# Patient Record
Sex: Female | Born: 1960 | Race: Black or African American | Hispanic: No | Marital: Single | Smoking: Never smoker
Health system: Southern US, Community
[De-identification: ages and names within clinical notes are randomized; demographics above are authoritative.]

## PROBLEM LIST (undated history)

## (undated) DIAGNOSIS — E559 Vitamin D deficiency, unspecified: Secondary | ICD-10-CM

## (undated) DIAGNOSIS — Z8613 Personal history of malaria: Secondary | ICD-10-CM

## (undated) DIAGNOSIS — D219 Benign neoplasm of connective and other soft tissue, unspecified: Secondary | ICD-10-CM

## (undated) DIAGNOSIS — R87619 Unspecified abnormal cytological findings in specimens from cervix uteri: Secondary | ICD-10-CM

## (undated) DIAGNOSIS — F32A Depression, unspecified: Secondary | ICD-10-CM

## (undated) DIAGNOSIS — F419 Anxiety disorder, unspecified: Secondary | ICD-10-CM

## (undated) DIAGNOSIS — N939 Abnormal uterine and vaginal bleeding, unspecified: Secondary | ICD-10-CM

## (undated) HISTORY — DX: Abnormal uterine and vaginal bleeding, unspecified: N93.9

## (undated) HISTORY — DX: Vitamin D deficiency, unspecified: E55.9

## (undated) HISTORY — PX: CHOLECYSTECTOMY: SHX55

## (undated) HISTORY — DX: Benign neoplasm of connective and other soft tissue, unspecified: D21.9

## (undated) HISTORY — DX: Unspecified abnormal cytological findings in specimens from cervix uteri: R87.619

## (undated) HISTORY — DX: Personal history of malaria: Z86.13

---

## 2005-05-16 ENCOUNTER — Other Ambulatory Visit: Admission: RE | Admit: 2005-05-16 | Discharge: 2005-05-16 | Payer: Self-pay | Admitting: Cardiology

## 2005-05-20 ENCOUNTER — Encounter: Admission: RE | Admit: 2005-05-20 | Discharge: 2005-05-20 | Payer: Self-pay | Admitting: Internal Medicine

## 2005-06-20 ENCOUNTER — Encounter: Admission: RE | Admit: 2005-06-20 | Discharge: 2005-06-20 | Payer: Self-pay | Admitting: Obstetrics and Gynecology

## 2007-01-05 ENCOUNTER — Other Ambulatory Visit: Admission: RE | Admit: 2007-01-05 | Discharge: 2007-01-05 | Payer: Self-pay | Admitting: Internal Medicine

## 2007-05-12 ENCOUNTER — Encounter: Admission: RE | Admit: 2007-05-12 | Discharge: 2007-05-12 | Payer: Self-pay | Admitting: Obstetrics & Gynecology

## 2010-04-23 ENCOUNTER — Encounter
Admission: RE | Admit: 2010-04-23 | Discharge: 2010-04-23 | Payer: Self-pay | Source: Home / Self Care | Attending: Internal Medicine | Admitting: Internal Medicine

## 2010-11-25 ENCOUNTER — Emergency Department (HOSPITAL_COMMUNITY): Payer: Managed Care, Other (non HMO)

## 2010-11-25 ENCOUNTER — Observation Stay (HOSPITAL_COMMUNITY)
Admission: EM | Admit: 2010-11-25 | Discharge: 2010-11-26 | Disposition: A | Discharge status: Home / Self Care | Payer: Managed Care, Other (non HMO) | Attending: Surgery | Admitting: Surgery

## 2010-11-25 ENCOUNTER — Other Ambulatory Visit (INDEPENDENT_AMBULATORY_CARE_PROVIDER_SITE_OTHER): Payer: Self-pay | Admitting: Surgery

## 2010-11-25 ENCOUNTER — Inpatient Hospital Stay (HOSPITAL_COMMUNITY): Payer: Managed Care, Other (non HMO)

## 2010-11-25 DIAGNOSIS — R112 Nausea with vomiting, unspecified: Secondary | ICD-10-CM

## 2010-11-25 DIAGNOSIS — R1011 Right upper quadrant pain: Secondary | ICD-10-CM | POA: Insufficient documentation

## 2010-11-25 DIAGNOSIS — K8010 Calculus of gallbladder with chronic cholecystitis without obstruction: Secondary | ICD-10-CM

## 2010-11-25 DIAGNOSIS — Z01812 Encounter for preprocedural laboratory examination: Secondary | ICD-10-CM | POA: Insufficient documentation

## 2010-11-25 DIAGNOSIS — R1013 Epigastric pain: Secondary | ICD-10-CM

## 2010-11-25 DIAGNOSIS — E669 Obesity, unspecified: Secondary | ICD-10-CM | POA: Insufficient documentation

## 2010-11-25 LAB — COMPREHENSIVE METABOLIC PANEL
ALT: 16 U/L (ref 0–35)
AST: 43 U/L — ABNORMAL HIGH (ref 0–37)
Albumin: 4 g/dL (ref 3.5–5.2)
Alkaline Phosphatase: 49 U/L (ref 39–117)
BUN: 9 mg/dL (ref 6–23)
Potassium: 3.8 mEq/L (ref 3.5–5.1)
Total Protein: 8 g/dL (ref 6.0–8.3)

## 2010-11-25 LAB — CBC
HCT: 41.9 % (ref 36.0–46.0)
Hemoglobin: 13.4 g/dL (ref 12.0–15.0)
MCHC: 32 g/dL (ref 30.0–36.0)
MCV: 80.4 fL (ref 78.0–100.0)
RBC: 5.21 MIL/uL — ABNORMAL HIGH (ref 3.87–5.11)
RDW: 13.2 % (ref 11.5–15.5)
WBC: 7.4 10*3/uL (ref 4.0–10.5)

## 2010-11-25 LAB — DIFFERENTIAL
Basophils Absolute: 0 10*3/uL (ref 0.0–0.1)
Basophils Relative: 0 % (ref 0–1)
Eosinophils Absolute: 0.1 10*3/uL (ref 0.0–0.7)
Monocytes Relative: 6 % (ref 3–12)
Neutro Abs: 5.5 10*3/uL (ref 1.7–7.7)
Neutrophils Relative %: 75 % (ref 43–77)

## 2010-11-25 LAB — LIPASE, BLOOD: Lipase: 18 U/L (ref 11–59)

## 2010-11-28 NOTE — Op Note (Signed)
[name], [name]              ACCOUNT NO.:  [id number]  MEDICAL RECORD NO.:  [id number]  LOCATION:  1526                         FACILITY:  [facility]  PHYSICIAN:  Velora Heckler, MD      DATE OF BIRTH:  Apr 25, 1960  DATE OF PROCEDURE:  11/25/2010 DATE OF DISCHARGE:                              OPERATIVE REPORT   PREOPERATIVE DIAGNOSIS:  Symptomatic cholelithiasis.  POSTOPERATIVE DIAGNOSIS:  Symptomatic cholelithiasis, subacute cholecystitis.  PROCEDURE:  Laparoscopic cholecystectomy with intraoperative cholangiography.  SURGEON:  Velora Heckler, MD, FACS  ASSISTANT:  Brayton El, PA-C  ANESTHESIA:  General per Dr. Eilene Ghazi.  ESTIMATED BLOOD LOSS:  Minimal.  PREPARATION:  ChloraPrep.  COMPLICATIONS:  None.  INDICATIONS:  The patient is a 50 year old black female who presents to the emergency department with a 24 hour history of abdominal pain. Ultrasound demonstrated gallstones with a large stone lodged in the gallbladder neck.  Liver function tests were normal.  The patient had persistent symptoms despite medical treatment in the emergency department.  She was seen in consultation by Surgery and prepared for the operating room.  BODY OF REPORT:  Procedure was done in OR #6 at the [facility].  The patient was brought to the operating room, placed in the supine position on the operating room table.  Following administration of general anesthesia, the patient is positioned and then prepped and draped in the usual strict aseptic fashion.  After ascertaining that, an adequate level of anesthesia been achieved, an infraumbilical incision was made transversely with a #15 blade. Dissection was carried down to the fascia.  Fascia was incised in the midline and the peritoneal cavity was entered cautiously.  0-Vicryl pursestring suture was placed in the fascia.  Hasson cannula was introduced under direct vision and secured with a pursestring  suture. Abdomen was insufflated with carbon dioxide.  Laparoscope was introduced and the abdomen explored.  Operative ports were placed along the right costal margin in the midline, midclavicular line, and anterior axillary line.  Fundus of the gallbladder was grasped and retracted cephalad. Gallbladder wall was markedly thickened.  Adhesions to the gallbladder wall were taken down with gentle blunt dissection and hemostasis obtained with electrocautery.  Gallbladder was quite fibrotic.  There was edema.  Dissection was begun at the neck of the gallbladder.  Cystic duct was identified.  It was dissected out along its length and a clip was placed at the neck of the gallbladder.  Cystic duct was incised.  A Cook cholangiography catheter was introduced through a stab wound in the right upper quadrant and inserted into the cystic duct.  It was secured with a Ligaclip.  Using C-arm fluoroscopy, real time cholangiography was performed.  There was rapid filling of a normal-caliber common bile duct.  There was free flow distally into the duodenum without filling defect or obstruction.  There was reflux of contrast into the right and left hepatic ductal systems.  Clip was withdrawn and Cook catheter was removed from the peritoneal cavity.  Cystic duct was doubly clipped and divided.  Cystic artery was dissected out, doubly clipped, and divided. Posterior branch of the cystic artery was doubly clipped and divided. Gallbladder  was excised from the gallbladder bed using the hook electrocautery for hemostasis.  The entire gallbladder was excised and placed into an EndoCatch bag.  It was withdrawn through the umbilical port.  It contains at least 1 large gallstone on palpation.  It was submitted to Pathology for review.  Right upper quadrant was irrigated with warm saline, which was evacuated.  Good hemostasis was noted.  Ports were removed under direct vision.  0-Vicryl pursestring sutures tied  securely.  Good hemostasis was noted at all port sites.  Pneumoperitoneum was released.  Port sites were anesthetized with local anesthetic.  Skin incisions were closed with interrupted 4-0 Monocryl subcuticular sutures.  Wounds were washed and dried and Benzoin and Steri-Strips were applied.  Sterile dressings were applied.  The patient was awakened from anesthesia and brought to the recovery room.  The patient tolerated the procedure well.   Velora Heckler, MD, FACS     TMG/MEDQ  D:  11/25/2010  T:  11/26/2010  Job:  [id number]  Electronically Signed by Darnell Level MD on 11/28/2010 02:13:14 PM

## 2010-12-10 ENCOUNTER — Ambulatory Visit (INDEPENDENT_AMBULATORY_CARE_PROVIDER_SITE_OTHER): Payer: Managed Care, Other (non HMO) | Admitting: General Surgery

## 2010-12-10 ENCOUNTER — Encounter (INDEPENDENT_AMBULATORY_CARE_PROVIDER_SITE_OTHER): Payer: Self-pay | Admitting: General Surgery

## 2010-12-10 VITALS — BP 120/82

## 2010-12-10 DIAGNOSIS — K8010 Calculus of gallbladder with chronic cholecystitis without obstruction: Secondary | ICD-10-CM

## 2010-12-10 NOTE — Progress Notes (Signed)
[name] is a 50 y.o. female who had a laparoscopic cholecystectomy with intraoperative cholangiogram.  The pathology report confirmed Chronic Cholecystitis and cholelithiasis.  The patient reports that they are feeling well with normal bowel movements and good appetite.  The pre-operative symptoms of abdominal pain, nausea, and vomiting have resolved.    Physical examination - Incisions appear well-healed with no sign of infection or bleeding.   Abdomen - soft, non-tender  Impression:  s/p laparoscopic cholecystectomy  Plan:  She may resume a regular diet and full activity.  She may follow-up on a PRN basis.

## 2010-12-12 NOTE — Discharge Summary (Signed)
  NAMEAERYN, [name]              ACCOUNT NO.:  [id number]  MEDICAL RECORD NO.:  [id number]  LOCATION:  1526                         FACILITY:  [facility]  PHYSICIAN:  Vanessa Heckler, MD      DATE OF BIRTH:  04-11-1960  DATE OF ADMISSION:  11/25/2010 DATE OF DISCHARGE:  11/26/2010                              DISCHARGE SUMMARY   ADMISSION DIAGNOSIS:  Cholelithiasis, symptomatic.  DISCHARGE DIAGNOSES:  Symptomatic cholelithiasis and subacute cholecystitis.  PROCEDURE:  Laparoscopic cholecystectomy with intraoperative cholangiogram on November 25, 2010, by Dr. Gerrit [name].  BRIEF HISTORY:  The patient is a 50 year old black female who presents with abdominal pain, nausea, and vomiting, which started yesterday after eating a large meal.  Continuation of workup in the ER which included an ultrasound of the abdomen, showed a single gallstone.  There was no wall thickening.  She was seen, evaluated by Dr. Gerrit [name] and subsequently taken to the OR for laparoscopic cholecystectomy.  ADMISSION MEDICATIONS:  None.  ALLERGIES:  None.  Please see dictated H and P for further details.  HOSPITAL COURSE:  The patient was admitted, taken to the OR, and underwent procedures described above.  She tolerated it well.  She has done well overnight.  She is afebrile.  Vital signs were stable.  Blood pressure is quite normal.  She is eating a full breakfast.  She is walking in the halls and is anxious for discharge at this time.  Her abdominal incisions looked good.  She has Steri-Strips.  She had been instructed to take care of the wound to keep her activity limited to lifting over 20 pounds for 2 weeks.  She will go home on ibuprofen and Percocet 1 to 2 p.o. q.4 h. p.r.n. for pain.  She will follow up in the [facility] on December 10, 2010, at 2:20.  CONDITION ON DISCHARGE:  Improved.     [name], P.A.   ______________________________ Vanessa Heckler, MD    WDJ/MEDQ  D:  11/26/2010   T:  11/26/2010  Job:  [id number]  cc:   Vanessa Heckler, MD [address] [place] [address]  [name], M.D. Fax: [phone]  Electronically Signed by Sherrie George P.A. on 12/02/2010 09:20:32 PM Electronically Signed by Darnell Level MD on 12/12/2010 11:26:26 AM

## 2010-12-12 NOTE — H&P (Signed)
[name], [name] NO.:  [id number]  MEDICAL RECORD NO.:  [id number]  LOCATION:  1526                         FACILITY:  [facility]  PHYSICIAN:  Velora Heckler, MD      DATE OF BIRTH:  1961-03-14  DATE OF ADMISSION:  11/25/2010 DATE OF DISCHARGE:                             HISTORY & PHYSICAL   PRIMARY CARE PHYSICIAN:  Juline Patch, M.D.  CHIEF COMPLAINT:  Abdominal pain, nausea, and vomiting.  HISTORY OF PRESENT ILLNESS:  [name] is a pleasant 50 year old African American female who started having some epigastric discomfort and indigestion-type symptoms yesterday afternoon.  She had a rather large meal of chicken alfredo followed by __________.  She developed this discomfort that persisted eventually all afternoon and into the evening that was associated with nausea.  She tried taking some over-the- counter herbal remedies that did not appear to improve her symptoms. Eventually, she was woken up a bit early this morning with persistent nausea and eventual vomiting.  She has no hematemesis.  She denies any fever and she denies any diarrhea or changes in her bowel function.  She presented to the emergency room due to the lack of improvement in her symptoms.  She states that years ago she was told that she may have had a gallbladder attack, but was not aware that she has had gallstones at that time.  She has a known history of fibroids and was planning to have surgery for that, but due to her international travel history, did not want to pursue that until she __________ for a long period of time.  She has been in the emergency department this morning in [facility] and had a workup including labs, which was normal and an ultrasound, which found the gallbladder stone lodged at the neck without any evidence of ductal dilatation.  There is no gallbladder wall thickening or pericholecystic fluid to suggest acute cholecystitis; however, we are called because  the patient has continued discomfort and nausea.  PAST MEDICAL HISTORY:  Fibroids.  SURGICAL HISTORY:  Negative.  FAMILY HISTORY:  Consistent with diabetes and hypertension.  SOCIAL HISTORY:  The patient is a full-time Engineer, agricultural with international travel history, but has been back up in the [place] and intends to stay here.  She, otherwise, denies any alcohol, tobacco, or illicit drug use.  ALLERGIES:  No known drug or LASIX allergies.  MEDICATIONS:  None.  REVIEW OF SYSTEMS:  Please see history of present illness for pertinent findings, otherwise complete 12-system review found  negative.  PHYSICAL EXAMINATION:  GENERAL:  A 50 year old [place] American female who is morbidly obese, but in no acute distress. VITAL SIGNS:  Show temperature of 98.6, heart rate of 75, respiratory rate of 18, blood pressure of 133/86, oxygen saturation is 98% on room air. ENT:  Unremarkable. NECK:  Supple without lymphadenopathy.  Trachea is midline, no thyromegaly or masses. LUNGS:  Clear to auscultation.  No wheezes, rhonchi, or rales.  Normal respiratory effort without use of accessory muscles. HEART:  Regular rate and rhythm.  No murmurs, gallops, or rubs. Carotids 2+ brisk without bruits.  Peripheral pulses intact and symmetrical. ABDOMEN:  Soft, nondistended, and nontender.  No surgical scars, no organomegaly or hernias are appreciated. RECTAL: Deferred. GENITOURINARY:  Deferred. EXTREMITIES:  Good active range of motion all extremities without crepitus or pain.  No muscle tone without atrophy. SKIN:  Otherwise warm to dry with good turgor.  No rashes, lesions, or nodules. NEUROLOGIC:  The patient is alert and oriented x3.  Appropriate affect.  DIAGNOSTICS:  CBC reveals a white blood cell count of 7.4, hemoglobin of 13.4, hematocrit of 41.9, and platelet count of 246.  Metabolic panel shows a sodium of 137, potassium 3.8, chloride of 101, CO2 of 26, BUN of 9, creatinine  of 0.69, and glucose of 119.  Liver enzymes within normal limits including lipase.  Ultrasound shows 1.8 cm gallstone lodged in neck of gallbladder.  No adjacent wall thickening or pericholecystic fluid is identified, bile duct within normal limits.  IMPRESSION:  Symptomatic cholelithiasis.  PLAN:  Given the patient's ongoing nausea and findings of the stone lodged in the neck of the gallbladder, we will see the patient and plan to proceed with laparoscopic cholecystectomy today.  I feel the patient would perfectly bounce back with recurrent symptoms postprandial if allowed to.  The patient understands and agrees with our plan.  The procedure of cholecystectomy laparoscopic versus open was discussed with the patient in detail including the risks, complications, and potential postoperative expectations.  She again understands and will consent     Vanessa El, PA-C   ______________________________ Velora Heckler, MD    KB/MEDQ  D:  11/25/2010  T:  11/25/2010  Job:  [id number]  Electronically Signed by [name]  on 12/02/2010 11:56:24 AM Electronically Signed by Darnell Level MD on 12/12/2010 11:26:40 AM

## 2010-12-25 ENCOUNTER — Encounter (INDEPENDENT_AMBULATORY_CARE_PROVIDER_SITE_OTHER): Payer: Managed Care, Other (non HMO) | Admitting: Surgery

## 2011-07-11 ENCOUNTER — Other Ambulatory Visit: Payer: Self-pay | Admitting: Internal Medicine

## 2011-07-11 DIAGNOSIS — Z1231 Encounter for screening mammogram for malignant neoplasm of breast: Secondary | ICD-10-CM

## 2011-07-18 ENCOUNTER — Ambulatory Visit
Admission: RE | Admit: 2011-07-18 | Discharge: 2011-07-18 | Disposition: A | Discharge status: Home / Self Care | Payer: Managed Care, Other (non HMO) | Source: Ambulatory Visit | Attending: Internal Medicine | Admitting: Internal Medicine

## 2011-07-18 DIAGNOSIS — Z1231 Encounter for screening mammogram for malignant neoplasm of breast: Secondary | ICD-10-CM

## 2013-10-24 ENCOUNTER — Ambulatory Visit (INDEPENDENT_AMBULATORY_CARE_PROVIDER_SITE_OTHER): Payer: 59 | Admitting: Nurse Practitioner

## 2013-10-24 ENCOUNTER — Encounter: Payer: Self-pay | Admitting: Nurse Practitioner

## 2013-10-24 VITALS — BP 114/66 | HR 60 | Ht 64.75 in | Wt 300.0 lb

## 2013-10-24 DIAGNOSIS — N950 Postmenopausal bleeding: Secondary | ICD-10-CM

## 2013-10-24 DIAGNOSIS — Z01419 Encounter for gynecological examination (general) (routine) without abnormal findings: Secondary | ICD-10-CM

## 2013-10-24 DIAGNOSIS — R87618 Other abnormal cytological findings on specimens from cervix uteri: Secondary | ICD-10-CM

## 2013-10-24 DIAGNOSIS — R897 Abnormal histological findings in specimens from other organs, systems and tissues: Secondary | ICD-10-CM

## 2013-10-24 DIAGNOSIS — Z0000 Encounter for general adult medical examination without abnormal findings: Secondary | ICD-10-CM

## 2013-10-24 MED ORDER — FLUCONAZOLE 150 MG PO TABS: 150.0000 mg | ORAL_TABLET | Freq: Once | ORAL | Status: DC

## 2013-10-24 NOTE — Patient Instructions (Signed)

## 2013-10-24 NOTE — Progress Notes (Signed)
Patient ID: [name], female   DOB: 1960-07-31, 53 y.o.   MRN: [id number] 53 y.o. G1P0010 Single African American Fe here to re establish for a NGYN exam.  She has a history of  irregular menses since 2008 -2009.  Then had endo biopsy and PUS and hysterectomy was discussed by Dr. Sabra Heck because [place] and having AUB.  Since then she has had a pattern of PMS symptoms monthly with spotting X 1 day. She was at a year of no menses when this monthly cycle started back again.   Not SA.   She also complains of a rash around the labia and peri anal ares.  During this time she has noted a mole or change in a mole on the left suprapubic area.  Not sure if it has been there before or just gotten larger.  There also seems to be a swollen knot in groin area.  Denies exposure to STD's, other urinary symptoms, or other illness.  Patient's last menstrual period was 09/12/2013.          Sexually active: No.  The current method of family planning is abstinence.    Exercising: No.  The patient does not participate in regular exercise at present. Smoker:  no  Health Maintenance: Pap:  Prior to 2012 MMG:  07/18/11, negative Colonoscopy:  Has scheduling apt on 10/28/13 TDaP:  2007 Labs: PCP takes care of labs and urine   reports that she has never smoked. She has never used smokeless tobacco. She reports that she does not drink alcohol or use illicit drugs.  Past Medical History  Diagnosis Date  . Vitamin D deficiency   . History of malaria   . Abnormal Pap smear of cervix     repeat pap OK, no  colpo  . Abnormal uterine bleeding     endo biopsy by Dr. Sabra Heck, [place]  . Fibroid     Past Surgical History  Procedure Laterality Date  . Cholecystectomy      laproscopic    Current Outpatient Prescriptions  Medication Sig Dispense Refill  . BIOTIN PO Take 1 tablet by mouth daily.      . Cholecalciferol (VITAMIN D-3) 5000 UNITS TABS Take 1 tablet by mouth daily.      Marland Kitchen GLUCOSAMINE HCL  PO Take by mouth.        Marland Kitchen ibuprofen (ADVIL,MOTRIN) 200 MG tablet Take 200 mg by mouth every 6 (six) hours as needed.      . Multiple Vitamin (MULTIVITAMIN) tablet Take 1 tablet by mouth daily.      Marland Kitchen UNKNOWN TO PATIENT B vitamin.  Unsure which one.      . fluconazole (DIFLUCAN) 150 MG tablet Take 1 tablet (150 mg total) by mouth once. Take one tablet.  Repeat in 48 hours if symptoms are not completely resolved.  2 tablet  0   No current facility-administered medications for this visit.    Family History  Problem Relation Age of Onset  . Breast cancer Maternal Grandmother   . Lung cancer Father   . Diabetes Mother   . Cancer Mother     melanoma  . Diabetes Sister   . Other Sister     seizure  . Diabetes Maternal Uncle   . Diabetes Maternal Grandfather   . Diabetes Maternal Uncle   . Epilepsy Cousin     ROS:  Pertinent items are noted in HPI.  Otherwise, a comprehensive ROS was negative.  Exam:  BP 114/66  Pulse 60  Ht 5' 4.75" (1.645 m)  Wt 300 lb (136.079 kg)  BMI 50.29 kg/m2  LMP 09/12/2013 Height: 5' 4.75" (164.5 cm)  Ht Readings from Last 3 Encounters:  10/24/13 5' 4.75" (1.645 m)    General appearance: alert, cooperative and appears stated age Head: Normocephalic, without obvious abnormality, atraumatic Neck: no adenopathy, supple, symmetrical, trachea midline and thyroid normal to inspection and palpation Lungs: clear to auscultation bilaterally Breasts: normal appearance, no masses or tenderness Heart: regular rate and rhythm Abdomen: soft, non-tender; no masses,  no organomegaly Extremities: extremities normal, atraumatic, no cyanosis or edema Skin: Skin color, texture, turgor normal. No rashes or lesions Lymph nodes: Cervical, supraclavicular, and axillary nodes normal. No abnormal inguinal nodes palpated Neurologic: Grossly normal   Pelvic: External genitalia:  Yeast external dermatitis.  There is a mole at the left suprapubic area.  She states swollen  lymph node or nodule.  Very small shotty node left inguinal and only barely palpable.              Urethra:  normal appearing urethra with no masses, tenderness or lesions              Bartholin's and Skene's: normal                 Vagina: normal appearing vagina with normal color and discharge, no lesions              Cervix: anteverted              Pap taken: Yes.   Bimanual Exam:  Uterus:  normal size, contour, position, consistency, mobility, non-tender              Adnexa: no mass, fullness, tenderness               Rectovaginal: Confirms               Anus:  normal sphincter tone, no lesions  A:  Well Woman with normal exam  Peri vs. Post menopausal bleeding - will check [place]  History of AUB  Mole changes left suprapubic area  Yeast vulvar dermatitis  P:   Reviewed health and wellness pertinent to exam  Pap smear taken today  Mammogram is due and is scheduled  Will treat yeast infection with Diflucan  Will have Dr. Sabra Heck check the mole on left suprapubic area and decide if removal or biopsy is needed.  Also will recheck on the shotty node in the groin.  Will plan on repeat endo biopsy and PUS to evaluate the PMB/ AUB  Counseled on breast self exam, mammography screening, adequate intake of calcium and vitamin D, diet and exercise, Kegel's exercises return annually or prn  An After Visit Summary was printed and given to the patient.

## 2013-10-25 ENCOUNTER — Telehealth: Payer: Self-pay

## 2013-10-25 LAB — FOLLICLE STIMULATING HORMONE: FSH: 37.3 m[IU]/mL

## 2013-10-25 NOTE — Telephone Encounter (Signed)
Message copied by Gerda Diss on Tue Oct 25, 2013 12:10 PM ------      Message from: Kem Boroughs R      Created: Tue Oct 25, 2013  8:06 AM       Let patient know that [facility] is only slight elevated so this may not PMB - but still should evaluate as discussed with endo biopsy and PUS as discussed. ------

## 2013-10-25 NOTE — Progress Notes (Signed)
Encounter reviewed by Dr. Brook Silva.  

## 2013-10-25 NOTE — Telephone Encounter (Signed)
LMOM to contact office 

## 2013-10-26 NOTE — Telephone Encounter (Signed)
Per stephanie, she informed pt of results Encounter closed

## 2013-10-27 LAB — IPS PAP TEST WITH HPV

## 2013-10-28 NOTE — Addendum Note (Signed)
Addended by: Regina Eck on: 10/28/2013 08:51 AM   Modules accepted: Orders

## 2013-11-03 ENCOUNTER — Telehealth: Payer: Self-pay | Admitting: Nurse Practitioner

## 2013-11-03 NOTE — Telephone Encounter (Signed)
Spoke with patient regarding colposcopy results and orders for pelvic ultrasound and possible endometrial biopsy.   Patient recently seen by Milford Cage, FNP with complaints of irregular spotting that has been ongoing for 6 years. Also, has a hx of fibroids that has been previously diagnosed by Dr. Sabra Heck, has not been in for exam in our office since 2008 prior to recent annual with Milford Cage, [place].   Patient agreeable to scheduling procedures.   Patient has not had a regular cycle, FSH 37.3. Advised would review with Dr. Sabra Heck for recommendations on order of procedures and timing because she has not had a regular cycle.  Patient states she is not sexually active.   Dr. Sabra Heck, schedule colposcopy first? Also, when to schedule colposcopy and possible endometrial with irregular cycles/spotting?

## 2013-11-03 NOTE — Telephone Encounter (Signed)
Spoke with patient. Discussed out of pocket expense for PUS ($25 copay) and Colpo ($393.82). Passed call to [facility] because patient has questions about colpo.

## 2013-11-03 NOTE — Telephone Encounter (Signed)
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 Patient had previous history of abnormal per history on pap. Needs colposcopy evaluation please schedule, order in ------

## 2013-11-04 NOTE — Telephone Encounter (Signed)
Since not SA, doesn't really matter about either colposcopy or ultrasound.  Spotting won't interfere with either.  Is there anyway I can do both in a day?

## 2013-11-07 NOTE — Telephone Encounter (Signed)
Message left to return call to [place] at [phone].   Need to schedule Pelvic Ultrasound and Colposcopy.

## 2013-11-08 NOTE — Telephone Encounter (Signed)
Spoke with patient. Advised PUS and colpo can not be done in the same day due to medical reasons. Advised will need to be done in separate appointments but the order is up to her. Patient is unavailable for ultrasound this Thursday. Appointment scheduled for next Thursday 8/13 at 3:30pm with Dr.Miller. Patient agreeable to date and time. Colpo scheduled for 8/27 at 10am with Dr.Miller. Patient agreeable to date and time.   Routing to Dr.Miller Cc: Milford Cage, FNP    Routing to provider for final review. Patient agreeable to disposition. Will close encounter

## 2013-11-08 NOTE — Telephone Encounter (Signed)
Phone call passed to me after speaking with Ivar Drape. Advised patient of message as seen below from [place]. Advised patient that I would have to speak with Dr.Miller and check scheduling as I want to allow enough time to have both done in one day and want to make sure we have her scheduled correctly. Patient is agreeable and verbalizes understanding.

## 2013-11-16 ENCOUNTER — Telehealth: Payer: Self-pay | Admitting: Obstetrics & Gynecology

## 2013-11-16 NOTE — Telephone Encounter (Signed)
Patient called to reschedule PUS appt, she thinks she has the flu.

## 2013-11-17 ENCOUNTER — Other Ambulatory Visit: Payer: 59

## 2013-11-17 ENCOUNTER — Other Ambulatory Visit: Payer: 59 | Admitting: Obstetrics & Gynecology

## 2013-11-21 ENCOUNTER — Ambulatory Visit (INDEPENDENT_AMBULATORY_CARE_PROVIDER_SITE_OTHER): Payer: 59 | Admitting: Gynecology

## 2013-11-21 ENCOUNTER — Telehealth: Payer: Self-pay

## 2013-11-21 VITALS — BP 132/74 | HR 76 | Resp 18 | Ht 64.75 in | Wt 299.0 lb

## 2013-11-21 DIAGNOSIS — N9089 Other specified noninflammatory disorders of vulva and perineum: Secondary | ICD-10-CM

## 2013-11-21 MED ORDER — DOXYCYCLINE HYCLATE 100 MG PO CAPS: 100.0000 mg | ORAL_CAPSULE | Freq: Two times a day (BID) | ORAL | Status: DC

## 2013-11-21 MED ORDER — LIDOCAINE 5 % EX OINT: 1.0000 "application " | TOPICAL_OINTMENT | Freq: Four times a day (QID) | CUTANEOUS | Status: DC | PRN

## 2013-11-21 MED ORDER — OXYCODONE-ACETAMINOPHEN 5-325 MG PO TABS
2.0000 | ORAL_TABLET | ORAL | Status: DC | PRN
Start: 2013-11-21 — End: 2013-12-05

## 2013-11-21 MED ORDER — MUPIROCIN 2 % EX OINT: 1.0000 "application " | TOPICAL_OINTMENT | Freq: Two times a day (BID) | CUTANEOUS | Status: DC

## 2013-11-21 NOTE — Progress Notes (Signed)
Subjective:     Patient ID: [name], female   DOB: 1960-09-02, 53 y.o.   MRN: [id number]  HPI Comments: Pt here with a several day history of labial swelling and discomfort.  Pt was noted to have a yeast vulvitis at her annual 10/2013 and was treated with diflucan.   Pt states that she was later seen at PCP for left axillary swelling and cough and she was given rx for bronchitis-zithromycin.  Pt states that her axilla resolved immediately but the labial lesion continued to get worse to the point that she was going to go to ER this am but was able to be seen at PCP who referred her here. Pt is not sexually active, she wares panitshields daily.    Review of Systems  Constitutional: Negative for fever, chills and fatigue.  Genitourinary: Positive for vaginal bleeding (spotting) and vaginal pain (left vulvar). Negative for decreased urine volume, vaginal discharge and genital sores.       Objective:   Physical Exam  Nursing note and vitals reviewed. Constitutional: She is oriented to person, place, and time. She appears well-developed and well-nourished. No distress.  Genitourinary:    There is no rash, tenderness, lesion or injury on the right labia. There is tenderness, lesion and injury on the left labia.  Pelvic adenopathy difficult to establish due to habitus Unable to place speculum due to extreme discomfort 2% lidocaine jelly placed on Left labia to aid in exam insufficient softness for I&D Area cleansed with betadine and white head lifted and clear to yellow fluid obtained and sent for culture  Neurological: She is alert and oriented to person, place, and time.  Skin: Skin is warm and dry. She is not diaphoretic.       Assessment:     Labial lesion      Plan:        Sitz baths three times a day-warm water Apply 5% xylocaine ointment, pill roll  Percocet as needed,  Donut to sit on bactroban to area twice a day Doxycycline twice a day F/u 1w Pt verbalizes  understanding of instructions Questions addressed

## 2013-11-21 NOTE — Patient Instructions (Signed)
Sitz baths three times a day-warm water Apply 5% xylocaine ointment, pill roll  Percocet as needed,  Donut to sit on bactroban to area twice a day Doxycycline twice a day

## 2013-11-21 NOTE — Telephone Encounter (Signed)
Spoke with patient. Appointment scheduled for today at 4pm with Dr.Lathrop. Patient agreeable to date and time. Patient would like to know if she will have to stay overnight some where as she was told she might have to. Advised patient we are usually able to open and drain here but that Dr.Lathrop will have to look at it first and make further recommendations from there. If anything further needs to be done that can't be done in the office Dr.Lathrop will let her know. Patient is agreeable and verbalizes understanding.  Routing to Dr.Lathrop Cc: Milford Cage, FNP   Routing to provider for final review. Patient agreeable to disposition. Will close encounter

## 2013-11-21 NOTE — Telephone Encounter (Signed)
Spoke with Stanton Kidney from Dr.Pang's office who is seeing Dr.Pang's patient's while she is out of the office. Stanton Kidney states that she saw patient last week and started on antibiotics for respiratory. Patient also had swelling in groin area. Thought the antibiotics would take care of it but patient is in office today for follow up and has a "left labial abscess that has grown in size and is painful." "[facility] she will need it [facility]. Since she see's Dr.Miller I wanted to call to see if you would like to see her first." Advised patient could be seen in our office. Advised Dr.Miller is currently in surgery but that I will check with a covering provider to see about best time for patient to be seen. Stanton Kidney states patient needs to be seen today. Advised would call back back with appointment time. They are both agreeable.

## 2013-11-23 ENCOUNTER — Telehealth: Payer: Self-pay | Admitting: Gynecology

## 2013-11-23 NOTE — Telephone Encounter (Signed)
Patient has a cyst and was give medication. Patient thinks the cyst "broke" last night. Patient is asking what is her next step?

## 2013-11-23 NOTE — Telephone Encounter (Signed)
Spoke with patient. She states that she is feeling about 50% better. Last night, labial cyst ruptured and patient states that she had discharge that she was not expecting. Patient at home from work today and resting in bed. Taking antibiotics as prescribed. Using Bactroban bid and lidocaine jelly qid.  Patient declines office visit for today with Dr. Charlies Constable for revaluation.  Advised that cyst is softening and that drainage is expected. Advised to keep area as clean and dry as possible, use clean hands and linens to clean area. Patient wondering if she can use non-stick guaze, advised to keep area uncovered if possible.  Advised patient to call back any time if worsens, develops fevers or any further concerns.  Advised patient to continue and finish abx as directed.  Routing to provider for final review. Patient agreeable to disposition. Will close encounter

## 2013-11-24 ENCOUNTER — Telehealth: Payer: Self-pay | Admitting: Gynecology

## 2013-11-24 ENCOUNTER — Other Ambulatory Visit: Payer: 59

## 2013-11-24 ENCOUNTER — Other Ambulatory Visit: Payer: 59 | Admitting: Obstetrics & Gynecology

## 2013-11-24 LAB — WOUND CULTURE
GRAM STAIN: NONE SEEN
GRAM STAIN: NONE SEEN

## 2013-11-24 NOTE — Telephone Encounter (Signed)
Spoke with patient. Patient states that pain in labial cyst is improving but she is still feeling "hardness" on labia and is concerned. She is wondering if she should be doing something else. We spoke yesterday as well and home care instructions were reviewed and patient taking oral Doxycycline, using Bactroban and lidocaine jelly as directed and using sitz baths as well. Offered office visit for evaluation as patient is having concerns. Advised can see provider tomorrow and decide if will still need follow up on Monday or not. Call back with any worsening in symptoms, fevers or further concerns. First available office visit offered with Dr. Quincy Simmonds for 11/25/13 at 1300 and patient agreeable.   Routing to provider for final review. Patient agreeable to disposition. Will close encounter

## 2013-11-24 NOTE — Telephone Encounter (Signed)
The cyst has burst and still draining but feels hard again. Is there something else she should be doing?

## 2013-11-25 ENCOUNTER — Ambulatory Visit (INDEPENDENT_AMBULATORY_CARE_PROVIDER_SITE_OTHER): Payer: 59 | Admitting: Obstetrics and Gynecology

## 2013-11-25 ENCOUNTER — Encounter: Payer: Self-pay | Admitting: Obstetrics and Gynecology

## 2013-11-25 VITALS — BP 126/84 | HR 80 | Ht 64.75 in | Wt 298.4 lb

## 2013-11-25 DIAGNOSIS — N762 Acute vulvitis: Secondary | ICD-10-CM

## 2013-11-25 DIAGNOSIS — N760 Acute vaginitis: Secondary | ICD-10-CM

## 2013-11-25 MED ORDER — AMOXICILLIN-POT CLAVULANATE 875-125 MG PO TABS: 1.0000 | ORAL_TABLET | Freq: Two times a day (BID) | ORAL | Status: DC

## 2013-11-25 MED ORDER — FLUCONAZOLE 150 MG PO TABS: 150.0000 mg | ORAL_TABLET | Freq: Once | ORAL | Status: DC

## 2013-11-25 NOTE — Progress Notes (Signed)
Patient ID: [name], female   DOB: [birth year], 53 y.o.   MRN: [id number] GYNECOLOGY  VISIT   HPI: 53 y.o.   Single  African American  female   G1P0010 with Patient's last menstrual period was 11/21/2013.   here for  Follow up on vulvar lesion.  States a cyst came up in the groin area.  Seen in office 8/17 and no I and D performed due to lack of fluctuance. Received Bactroban and Doxycycline, xylocaine jelly and Percocet. Drained on its own the night of the visit.  Feels like the area is somewhat better but has moved up along the labia.  No fevers.  Wound culture negative from 8/17.  Has not had this before. 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  No outbreak since then.   Of note, patient had a recent treatment of URI with Azythromycin.   GYNECOLOGIC HISTORY: Patient's last menstrual period was 11/21/2013. Contraception: abstinence   Menopausal hormone therapy: n/a        OB History   Grav Para Term Preterm Abortions TAB SAB Ect Mult Living   1    1 1              There are no active problems to display for this patient.   Past Medical History  Diagnosis Date  . Vitamin D deficiency   . History of malaria   . Abnormal Pap smear of cervix     repeat pap OK, no  colpo  . Abnormal uterine bleeding     endo biopsy by Dr. Sabra Heck, [place]  . Fibroid     Past Surgical History  Procedure Laterality Date  . Cholecystectomy      laproscopic    Current Outpatient Prescriptions  Medication Sig Dispense Refill  . benzonatate (TESSALON) 100 MG capsule Take by mouth 3 (three) times daily as needed for cough.      . doxycycline (VIBRAMYCIN) 100 MG capsule Take 1 capsule (100 mg total) by mouth 2 (two) times daily. Take BID for 14 days.  Take with food as can cause GI distress.  28 capsule  0  . ibuprofen (ADVIL,MOTRIN) 200 MG tablet Take 200 mg by mouth every 6 (six) hours as needed.      . lidocaine (XYLOCAINE) 5 % ointment Apply 1 application topically 4 (four) times daily as  needed.  1.25 g  0  . mupirocin ointment (BACTROBAN) 2 % Place 1 application into the nose 2 (two) times daily.  22 g  0  . BIOTIN PO Take 1 tablet by mouth daily.      . Cholecalciferol (VITAMIN D-3) 5000 UNITS TABS Take 1 tablet by mouth daily.      Marland Kitchen GLUCOSAMINE HCL PO Take by mouth.        Marland Kitchen MAGNESIUM PO Take by mouth.      . Multiple Vitamin (MULTIVITAMIN) tablet Take 1 tablet by mouth daily.      Marland Kitchen oxyCODONE-acetaminophen (PERCOCET) 5-325 MG per tablet Take 2 tablets by mouth every 4 (four) hours as needed. use only as much as needed to relieve pain  15 tablet  0  . UNKNOWN TO PATIENT B vitamin.  Unsure which one.       No current facility-administered medications for this visit.     ALLERGIES: Shrimp  Family History  Problem Relation Age of Onset  . Breast cancer Maternal Grandmother   . Lung cancer Father   . Diabetes Mother   . Cancer Mother  melanoma  . Diabetes Sister   . Other Sister     seizure  . Diabetes Maternal Uncle   . Diabetes Maternal Grandfather   . Diabetes Maternal Uncle   . Epilepsy Cousin     History   Social History  . Marital Status: Single    Spouse Name: N/A    Number of Children: 0  . Years of Education: N/A   Occupational History  .     Social History Main Topics  . Smoking status: Never Smoker   . Smokeless tobacco: Never Used  . Alcohol Use: No  . Drug Use: No  . Sexual Activity: No   Other Topics Concern  . Not on file   Social History Narrative  . No narrative on file    ROS:  Pertinent items are noted in HPI.  PHYSICAL EXAMINATION:    BP 126/84  Pulse 80  Ht 5' 4.75" (1.645 m)  Wt 298 lb 6.4 oz (135.353 kg)  BMI 50.02 kg/m2  LMP 11/21/2013     General appearance: alert, cooperative and appears stated age No abnormal inguinal nodes palpated  Pelvic: External genitalia:  Indurated left labia majora with pus extruding from 2 areas of the mid-superior labia.   HSV culture taken. Firm tissue.  Warm to touch.                Urethra:  normal appearing urethra with no masses, tenderness or lesions              Bartholins and Skenes: normal                   I and D of vulvar abscess. Consent for procedure.  Sterile prep with Hibiclens.   Local 1% lidocaine - lot number [id number], Expiration 09/06/14.         Scalpel used to open 1 cm area and probe with hemostat. Wound culture sent.  Minimal EBL.  No complications.             ASSESSMENT  Vulvar cellulitis.  I and D performed, but negative for true abscess. Negative wound culture from 11/21/13.  History of HSV.    PLAN  Stop Doxycycline.  Switch to Augmentin 875 mg po bid for 7 days.  Warm soaks with antibacterial soap.  Stop Bactroban and xylocaine jelly.  Recheck in 3 days in office.  To hospital during the weekend if has progression of cellulitis or develops fever.    An After Visit Summary was printed and given to the patient.  _15___ minutes face to face time of which over 50% was spent in counseling.

## 2013-11-28 ENCOUNTER — Ambulatory Visit (INDEPENDENT_AMBULATORY_CARE_PROVIDER_SITE_OTHER): Payer: 59 | Admitting: Obstetrics and Gynecology

## 2013-11-28 ENCOUNTER — Ambulatory Visit: Payer: 59 | Admitting: Gynecology

## 2013-11-28 ENCOUNTER — Encounter: Payer: Self-pay | Admitting: Obstetrics and Gynecology

## 2013-11-28 VITALS — BP 120/80 | HR 70 | Ht 64.75 in | Wt 303.2 lb

## 2013-11-28 DIAGNOSIS — N762 Acute vulvitis: Secondary | ICD-10-CM

## 2013-11-28 DIAGNOSIS — N760 Acute vaginitis: Secondary | ICD-10-CM

## 2013-11-28 LAB — WOUND CULTURE
Gram Stain: NONE SEEN
Organism ID, Bacteria: NO GROWTH

## 2013-11-28 LAB — HERPES SIMPLEX VIRUS CULTURE: Organism ID, Bacteria: NOT DETECTED

## 2013-11-28 NOTE — Progress Notes (Signed)
Patient ID: [name], female   DOB: 1960-10-04, 53 y.o.   MRN: [id number] GYNECOLOGY  VISIT   HPI: 53 y.o.   Single  African American  female   [id number] with Patient's last menstrual period was 11/21/2013.   here for  Follow up visit.  Status post Incision and Drainage of left labia majora 3 days ago.  No abscess noted.  Switched antibiotic to Augmentin and stopped Doxycycline.  Repeat wound culture is negative for bacteria.  Herpes culture is not back yet.   Feeling better today.  Some itching externally on left side.  No fevers.   Status she is due to come in for colposcopy.  Pap was normal but positive HR HPV. Also will be having a pelvic ultrasound for uterine bleeding.  Goes for almost one calendar year and then has spotting.  FSH 37.3 one month ago.   GYNECOLOGIC HISTORY: Patient's last menstrual period was 11/21/2013. Contraception:   Abstinence. Menopausal hormone therapy: n/a        OB History   Grav Para Term Preterm Abortions TAB SAB Ect Mult Living   1    1 1              Patient Active Problem List   Diagnosis Date Noted  . Vulvar cellulitis 11/25/2013    Past Medical History  Diagnosis Date  . Vitamin D deficiency   . History of malaria   . Abnormal Pap smear of cervix     repeat pap OK, no  colpo  . Abnormal uterine bleeding     endo biopsy by Dr. Sabra Heck, [place]  . Fibroid     Past Surgical History  Procedure Laterality Date  . Cholecystectomy      laproscopic    Current Outpatient Prescriptions  Medication Sig Dispense Refill  . amoxicillin-clavulanate (AUGMENTIN) 875-125 MG per tablet Take 1 tablet by mouth 2 (two) times daily. Take one tablet BID x 7D  14 tablet  0  . benzonatate (TESSALON) 100 MG capsule Take by mouth 3 (three) times daily as needed for cough.      Marland Kitchen BIOTIN PO Take 1 tablet by mouth daily.      . Cholecalciferol (VITAMIN D-3) 5000 UNITS TABS Take 1 tablet by mouth daily.      . fluconazole (DIFLUCAN) 150 MG tablet Take 1  tablet (150 mg total) by mouth once. Take one tablet.  Repeat in 48 hours if symptoms are not completely resolved.  2 tablet  0  . GLUCOSAMINE HCL PO Take by mouth.        Marland Kitchen ibuprofen (ADVIL,MOTRIN) 200 MG tablet Take 200 mg by mouth every 6 (six) hours as needed.      Marland Kitchen MAGNESIUM PO Take by mouth.      . Multiple Vitamin (MULTIVITAMIN) tablet Take 1 tablet by mouth daily.      Marland Kitchen UNKNOWN TO PATIENT B vitamin.  Unsure which one.      . lidocaine (XYLOCAINE) 5 % ointment Apply 1 application topically 4 (four) times daily as needed.  1.25 g  0  . mupirocin ointment (BACTROBAN) 2 % Place 1 application into the nose 2 (two) times daily.  22 g  0  . oxyCODONE-acetaminophen (PERCOCET) 5-325 MG per tablet Take 2 tablets by mouth every 4 (four) hours as needed. use only as much as needed to relieve pain  15 tablet  0   No current facility-administered medications for this visit.     ALLERGIES: Shrimp  Family History  Problem Relation Age of Onset  . Breast cancer Maternal Grandmother   . Lung cancer Father   . Diabetes Mother   . Cancer Mother     melanoma  . Diabetes Sister   . Other Sister     seizure  . Diabetes Maternal Uncle   . Diabetes Maternal Grandfather   . Diabetes Maternal Uncle   . Epilepsy Cousin     History   Social History  . Marital Status: Single    Spouse Name: N/A    Number of Children: 0  . Years of Education: N/A   Occupational History  .     Social History Main Topics  . Smoking status: Never Smoker   . Smokeless tobacco: Never Used  . Alcohol Use: No  . Drug Use: No  . Sexual Activity: No   Other Topics Concern  . Not on file   Social History Narrative  . No narrative on file    ROS:  Pertinent items are noted in HPI.  PHYSICAL EXAMINATION:    BP 120/80  Pulse 70  Ht 5' 4.75" (1.645 m)  Wt 303 lb 3.2 oz (137.531 kg)  BMI 50.82 kg/m2  LMP 11/21/2013     General appearance: alert, cooperative and appears stated age    Pelvic: External  genitalia:   Left labia majora with 0.75 cm opening of the skin.  Small amount of drainage noted.  Induration present but improved and not extending so far in a superior direction.               Urethra:  normal appearing urethra with no masses, tenderness or lesions              Bartholins and Skenes: normal                 Vagina: normal appearing vagina with normal color and discharge, no lesions              Cervix: cannot see.  Intolerant of speculum exam and long Pederson not adequate for visualization of cervix.                    Bimanual Exam:  Uterus:  uterus is normal size, shape, consistency and nontender.  Exam limited by body habitus.                                       Adnexa:  Exam limited by body habitus.                                       ASSESSMENT  Vulval cellulitis.  Improved on Augmentin.  Culture repeat is negative.  HSV culture pending.  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  Abnormal uterine bleeding.   PLAN  Continue Augmentin.  OK for Diflucan 150 mg now and repeat at end of course of antibiotics.  Wait to do colposcopy and pelvic ultrasound until after cellulitis has resolved.  Will contact with results of HSV culture.  Follow up in one week, sooner as needed.    An After Visit Summary was printed and given to the patient.  __15____ minutes face to face time of which over 50% was spent in counseling.

## 2013-11-29 ENCOUNTER — Telehealth: Payer: Self-pay

## 2013-11-29 NOTE — Telephone Encounter (Signed)
Message copied by Jasmine Awe on Tue Nov 29, 2013  9:37 AM ------      Message from: [place]      Created: Mon Nov 28, 2013  5:19 PM       Please inform patient that HSV culture is negative.       I really think that the reaction on her labia was cellulitis due to bacterial infection.       Continue with the Augmentin as we planned.       I will see her back in one week.             Cc- Marisa Sprinkles ------

## 2013-11-29 NOTE — Telephone Encounter (Signed)
Spoke with patient. Advised of message and results as seen below. Patient is agreeable and verbalizes understanding. Has one week follow up scheduled for 8/31 at 11:30am with Dr.Silva.  Routing to provider for final review. Patient agreeable to disposition. Will close encounter

## 2013-12-01 ENCOUNTER — Ambulatory Visit: Payer: 59 | Admitting: Obstetrics & Gynecology

## 2013-12-05 ENCOUNTER — Ambulatory Visit (INDEPENDENT_AMBULATORY_CARE_PROVIDER_SITE_OTHER): Payer: 59 | Admitting: Obstetrics and Gynecology

## 2013-12-05 ENCOUNTER — Encounter: Payer: Self-pay | Admitting: Obstetrics and Gynecology

## 2013-12-05 VITALS — BP 120/80 | HR 70 | Ht 64.75 in | Wt 299.8 lb

## 2013-12-05 DIAGNOSIS — N926 Irregular menstruation, unspecified: Secondary | ICD-10-CM

## 2013-12-05 DIAGNOSIS — L02818 Cutaneous abscess of other sites: Secondary | ICD-10-CM

## 2013-12-05 DIAGNOSIS — R87810 Cervical high risk human papillomavirus (HPV) DNA test positive: Secondary | ICD-10-CM

## 2013-12-05 DIAGNOSIS — L03818 Cellulitis of other sites: Secondary | ICD-10-CM

## 2013-12-05 DIAGNOSIS — N939 Abnormal uterine and vaginal bleeding, unspecified: Secondary | ICD-10-CM

## 2013-12-05 NOTE — Progress Notes (Signed)
Patient ID: [name], female   DOB: 02-Jul-1960, 53 y.o.   MRN: [id number] GYNECOLOGY  VISIT   HPI: 53 y.o.   Single  African American  female   G1P0010 with Patient's last menstrual period was 11/21/2013.   here for  1 week follow up.   Finished the Augmentin 2 days ago.  Feeling better.  Feels like herself again.  Did have a small spot of bleeding from the vulva this weekend.  Still feels an area of induration, which was previously determined to be likely a lymph node.   Wound culture and HSV cultures have all been negative.   Patient is due to schedule colposcopy for positive HR HPV and pelvic ultrasound and endometrial biopsy for abnormal uterine bleeding.  Had cycles stop for one year and then has resumed with light periodic bleeding.  These visits were put on hold while the patient's vulvar cellulitis was being treated.   GYNECOLOGIC HISTORY: Patient's last menstrual period was 11/21/2013. Contraception: abstinence    Menopausal hormone therapy: none        OB History   Grav Para Term Preterm Abortions TAB SAB Ect Mult Living   1    1 1              Patient Active Problem List   Diagnosis Date Noted  . Vulvar cellulitis 11/25/2013    Past Medical History  Diagnosis Date  . Vitamin D deficiency   . History of malaria   . Abnormal Pap smear of cervix     repeat pap OK, no  colpo  . Abnormal uterine bleeding     endo biopsy by Dr. Sabra Heck, [place]  . Fibroid     Past Surgical History  Procedure Laterality Date  . Cholecystectomy      laproscopic    Current Outpatient Prescriptions  Medication Sig Dispense Refill  . BIOTIN PO Take 1 tablet by mouth daily.      . Cholecalciferol (VITAMIN D-3) 5000 UNITS TABS Take 1 tablet by mouth daily.      Marland Kitchen GLUCOSAMINE HCL PO Take by mouth.        Marland Kitchen ibuprofen (ADVIL,MOTRIN) 200 MG tablet Take 200 mg by mouth every 6 (six) hours as needed.      Marland Kitchen MAGNESIUM PO Take by mouth.      . Multiple Vitamin (MULTIVITAMIN) tablet Take  1 tablet by mouth daily.      Marland Kitchen UNKNOWN TO PATIENT B vitamin.  Unsure which one.      . benzonatate (TESSALON) 100 MG capsule Take by mouth 3 (three) times daily as needed for cough.       No current facility-administered medications for this visit.     ALLERGIES: Shrimp  Family History  Problem Relation Age of Onset  . Breast cancer Maternal Grandmother   . Lung cancer Father   . Diabetes Mother   . Cancer Mother     melanoma  . Diabetes Sister   . Other Sister     seizure  . Diabetes Maternal Uncle   . Diabetes Maternal Grandfather   . Diabetes Maternal Uncle   . Epilepsy Cousin     History   Social History  . Marital Status: Single    Spouse Name: N/A    Number of Children: 0  . Years of Education: N/A   Occupational History  .     Social History Main Topics  . Smoking status: Never Smoker   . Smokeless tobacco: Never  Used  . Alcohol Use: No  . Drug Use: No  . Sexual Activity: No   Other Topics Concern  . Not on file   Social History Narrative  . No narrative on file    ROS:  Pertinent items are noted in HPI.  PHYSICAL EXAMINATION:    BP 120/80  Pulse 70  Ht 5' 4.75" (1.645 m)  Wt 299 lb 12.8 oz (135.988 kg)  BMI 50.25 kg/m2  LMP 11/21/2013     General appearance: alert, cooperative and appears stated age  Pelvic: External genitalia:  Labia majora now symmetrical in size.  Left labia majora with small area of drainage at old incision site, which has almost completely closed. Palpable small 0.75 cm inguinal node, nontender.  5 mm soft raised mons nevus, light brown and regular borders.             ASSESSMENT    Vulvar cellulitis resolved.  Abnormal uterine bleeding.  Positive HR HPV status.   PLAN  Patient will make appointments for her pelvic ultrasound and endometrial biopsy and then a separate appointment for the colposcopy.    An After Visit Summary was printed and given to the patient.  _15_____ minutes face to face time of which  over 50% was spent in counseling.

## 2013-12-16 ENCOUNTER — Telehealth: Payer: Self-pay | Admitting: Obstetrics and Gynecology

## 2013-12-16 ENCOUNTER — Encounter: Payer: Self-pay | Admitting: Obstetrics and Gynecology

## 2013-12-16 ENCOUNTER — Ambulatory Visit (INDEPENDENT_AMBULATORY_CARE_PROVIDER_SITE_OTHER): Payer: 59 | Admitting: Obstetrics and Gynecology

## 2013-12-16 ENCOUNTER — Other Ambulatory Visit: Payer: Self-pay | Admitting: Obstetrics and Gynecology

## 2013-12-16 VITALS — BP 130/80 | HR 88 | Ht 64.75 in | Wt 301.4 lb

## 2013-12-16 DIAGNOSIS — R87810 Cervical high risk human papillomavirus (HPV) DNA test positive: Secondary | ICD-10-CM

## 2013-12-16 DIAGNOSIS — N939 Abnormal uterine and vaginal bleeding, unspecified: Secondary | ICD-10-CM

## 2013-12-16 DIAGNOSIS — N764 Abscess of vulva: Secondary | ICD-10-CM

## 2013-12-16 DIAGNOSIS — N926 Irregular menstruation, unspecified: Secondary | ICD-10-CM

## 2013-12-16 MED ORDER — FLUCONAZOLE 150 MG PO TABS: 150.0000 mg | ORAL_TABLET | Freq: Once | ORAL | Status: DC

## 2013-12-16 MED ORDER — SULFAMETHOXAZOLE-TRIMETHOPRIM 800-160 MG PO TABS: 1.0000 | ORAL_TABLET | Freq: Two times a day (BID) | ORAL | Status: DC

## 2013-12-16 NOTE — Telephone Encounter (Signed)
Needs recheck today with me.

## 2013-12-16 NOTE — Progress Notes (Signed)
Patient ID: [name], female   DOB: Jul 25, 1960, 53 y.o.   MRN: [id number] GYNECOLOGY VISIT  PCP:   Referring provider:   HPI: 53 y.o.   Single  African American  female   G1P0010 with Patient's last menstrual period was 11/21/2013.   here to evaluate left vulvar bleeding/drainage from her vulva.   Patient has been treated for vulvar abscess with Doxycycline and then had an incision and drainage for an indurated left labia majora and failure to respond to Doxycycline.  Was subsequently treated with Augmentin and felt she improved but feels like the knot in her left grain never really resolved.  Skin feels like a paper cut.  No fevers.   Has had two skin cultures which showed WBCs but negative for bacteria.  HSV culture negative.   Patient travels outside the [place] and has visited [place] and [place].  History of malaria.  Works with the [employer].   Denies history of inflammatory bowel conditions.   Needs colposcopy for positive HR HPV and negative pap smear. Also needs to have pelvic ultrasound and endometrial biopsy for irregular vaginal bleeding.   GYNECOLOGIC HISTORY: Patient's last menstrual period was 11/21/2013. Sexually active:  no Partner preference: female Contraception:   abstinence Menopausal hormone therapy: n/a DES exposure:  no  Blood transfusions: no   Sexually transmitted diseases:   HSV GYN procedures and prior surgeries:  Endometrial biopsy-neg. Last mammogram: 07-18-11 wnl:[facility]                Last pap and high risk HPV testing:  10-24-13 wnl:Pos. HR  HPV--needs colposcopy History of abnormal pap smear: 10-24-13 wnl:Pos. HR HPV   OB History   Grav Para Term Preterm Abortions TAB SAB Ect Mult Living   1    1 1            LIFESTYLE: Exercise:               Tobacco:  no Alcohol:     no Drug use:  no  Patient Active Problem List   Diagnosis Date Noted  . Vulvar cellulitis 11/25/2013    Past Medical History  Diagnosis Date  .  Vitamin D deficiency   . History of malaria   . Abnormal Pap smear of cervix     repeat pap OK, no  colpo  . Abnormal uterine bleeding     endo biopsy by Dr. Sabra Heck, [place]  . Fibroid     Past Surgical History  Procedure Laterality Date  . Cholecystectomy      laproscopic    Current Outpatient Prescriptions  Medication Sig Dispense Refill  . BIOTIN PO Take 1 tablet by mouth daily.      . Cholecalciferol (VITAMIN D-3) 5000 UNITS TABS Take 1 tablet by mouth daily.      Marland Kitchen GLUCOSAMINE HCL PO Take by mouth.        Marland Kitchen ibuprofen (ADVIL,MOTRIN) 200 MG tablet Take 200 mg by mouth every 6 (six) hours as needed.      Marland Kitchen MAGNESIUM PO Take by mouth.      . Multiple Vitamin (MULTIVITAMIN) tablet Take 1 tablet by mouth daily.      Marland Kitchen UNKNOWN TO PATIENT B vitamin.  Unsure which one.       No current facility-administered medications for this visit.     ALLERGIES: Shrimp  Family History  Problem Relation Age of Onset  . Breast cancer Maternal Grandmother   . Lung cancer Father   .  Diabetes Mother   . Cancer Mother     melanoma  . Diabetes Sister   . Other Sister     seizure  . Diabetes Maternal Uncle   . Diabetes Maternal Grandfather   . Diabetes Maternal Uncle   . Epilepsy Cousin     History   Social History  . Marital Status: Single    Spouse Name: N/A    Number of Children: 0  . Years of Education: N/A   Occupational History  .     Social History Main Topics  . Smoking status: Never Smoker   . Smokeless tobacco: Never Used  . Alcohol Use: No  . Drug Use: No  . Sexual Activity: No   Other Topics Concern  . Not on file   Social History Narrative  . No narrative on file    ROS:  Pertinent items are noted in HPI.  PHYSICAL EXAMINATION:    BP 130/80  Pulse 88  Ht 5' 4.75" (1.645 m)  Wt 301 lb 6.4 oz (136.714 kg)  BMI 50.52 kg/m2  LMP 11/21/2013   Wt Readings from Last 3 Encounters:  12/16/13 301 lb 6.4 oz (136.714 kg)  12/05/13 299 lb 12.8 oz (135.988 kg)   11/28/13 303 lb 3.2 oz (137.531 kg)     Ht Readings from Last 3 Encounters:  12/16/13 5' 4.75" (1.645 m)  12/05/13 5' 4.75" (1.645 m)  11/28/13 5' 4.75" (1.645 m)    General appearance: alert, cooperative and appears stated age    Pelvic: External genitalia:  4 mm round skin opening of the left labia with what appears to be a fistulous tract to the subcutaneous tissue.  Bloody pus like drainage, tender.  2 mm area just lateral to that of weeping skin with pus like drainage.  No generalized labial induration.               Urethra:  normal appearing urethra with no masses, tenderness or lesions              Bartholins and Skenes: normal                 Vagina: normal appearing vagina with normal color and discharge, no lesions              Cervix: normal appearance.  No blood noted.   ASSESSMENT  Vulvar fistula.  Suspicious for MRSA infection but previous negative cultures. Potential risk for tuberculosis.  Positive HR HPV.  Abnormal uterine bleeding.   PLAN  Wound culture aerobic and anaerobic with AFB stain and culture.  Bactrim DS po bit for 14 days.  Proceed with pelvic ultrasound, EMB, and colposcopy.   An After Visit Summary was printed and given to the patient.  25 minutes face to face time of which over 50% was spent in counseling.

## 2013-12-16 NOTE — Progress Notes (Signed)
Patient is scheduled for Pelvic ultrasound and endometrial biopsy for 12/22/13. pre-procedure instructions given. Motrin instructions given. Motrin=Advil=Ibuprofen Can take 800 mg (Can purchase over the counter, you will need four 200 mg pills). Take with food. Make sure to eat a meal and drink fluids prior to appointment.  Advised patient if she is still having vulvar pain/infection to please call our office prior to appointment so that ultrasound/ endometrial biopsy  Can be cancelled and she can just have follow up.   Patient also scheduled for colposcopy for 01/11/14 at 1500 with Dr. Quincy Simmonds. Pre-procedure instructions given as well. Same as above. Patient is not sexually active. Patient given printed instruction sheet for Pelvic ultrasound   endometrial biopsy and colposcopy with appointment information.   Patient will call our office with any further questions and is aware of costs of procedures.

## 2013-12-16 NOTE — Telephone Encounter (Signed)
Spoke with patient. Appointment scheduled for today at 2:30pm with Dr.Silva. Patient agreeable to date and time.  Routing to provider for final review. Patient agreeable to disposition. Will close encounter

## 2013-12-16 NOTE — Telephone Encounter (Signed)
Patient calling stating, "My wound has re-opened from when I had a cyst lanced a few weeks ago." Please advise? Patient hope for an appointment with Dr. Quincy Simmonds today.

## 2013-12-16 NOTE — Telephone Encounter (Signed)
Spoke with patient. Patient states that she woke up this morning to use the restroom her "my underwear were stuck to my skin. There was a lot of moisture and blood when I pulled them away. I started to bleed a lot so I used gauze to cover the area. The bleeding has decreased some. It looks like the skin is open." Blood is bright red in color. Denies any yellow or green fluid. States there is still a lump in her labial area that never went away. Area is still swollen. Denies redness around site. States the area is uncomfortable but not painful. "It looks like the area is open but not from the incision. It's like I am missing a chunk of skin." Advised patient would speak with Dr.Silva and give patient a call back with further recommendations and instructions. Patient agreeable.  Patient was seen on 8/17 for labial lesion with I&D. Given bactroban and doxycycline. Negative wound culture. On 8/21 seen for I&D. Started on Augmentin 875mg  for 7 days.

## 2013-12-19 LAB — WOUND CULTURE
GRAM STAIN: NONE SEEN
GRAM STAIN: NONE SEEN
GRAM STAIN: NONE SEEN

## 2013-12-21 ENCOUNTER — Telehealth: Payer: Self-pay | Admitting: Obstetrics and Gynecology

## 2013-12-21 DIAGNOSIS — N938 Other specified abnormal uterine and vaginal bleeding: Secondary | ICD-10-CM

## 2013-12-21 LAB — ANAEROBIC CULTURE
Gram Stain: NONE SEEN
Gram Stain: NONE SEEN
Gram Stain: NONE SEEN

## 2013-12-21 NOTE — Telephone Encounter (Signed)
Pt says that her infection has cleared but the wound is still open and wants to make sure its okay to have the PUS?

## 2013-12-21 NOTE — Telephone Encounter (Signed)
Left detailed message on patient voicemail. Okay per designated party release form. Advised will continue as planned for pelvic ultrasound and endometrial biopsy. Reminded of appointment times. Advised to return call if any concerns.  Marland Kitchen

## 2013-12-22 ENCOUNTER — Ambulatory Visit (INDEPENDENT_AMBULATORY_CARE_PROVIDER_SITE_OTHER): Payer: 59 | Admitting: Obstetrics and Gynecology

## 2013-12-22 ENCOUNTER — Ambulatory Visit (INDEPENDENT_AMBULATORY_CARE_PROVIDER_SITE_OTHER): Payer: 59

## 2013-12-22 ENCOUNTER — Encounter: Payer: Self-pay | Admitting: Obstetrics and Gynecology

## 2013-12-22 VITALS — BP 130/80 | HR 80 | Ht 64.75 in | Wt 297.0 lb

## 2013-12-22 DIAGNOSIS — N926 Irregular menstruation, unspecified: Secondary | ICD-10-CM

## 2013-12-22 DIAGNOSIS — N760 Acute vaginitis: Secondary | ICD-10-CM

## 2013-12-22 DIAGNOSIS — N841 Polyp of cervix uteri: Secondary | ICD-10-CM

## 2013-12-22 DIAGNOSIS — N939 Abnormal uterine and vaginal bleeding, unspecified: Secondary | ICD-10-CM

## 2013-12-22 DIAGNOSIS — N949 Unspecified condition associated with female genital organs and menstrual cycle: Secondary | ICD-10-CM

## 2013-12-22 DIAGNOSIS — N938 Other specified abnormal uterine and vaginal bleeding: Secondary | ICD-10-CM

## 2013-12-22 DIAGNOSIS — N762 Acute vulvitis: Secondary | ICD-10-CM

## 2013-12-22 MED ORDER — SULFAMETHOXAZOLE-TRIMETHOPRIM 800-160 MG PO TABS: 1.0000 | ORAL_TABLET | Freq: Two times a day (BID) | ORAL | Status: DC

## 2013-12-22 NOTE — Patient Instructions (Signed)
Endometrial Biopsy, Care After Refer to this sheet in the next few weeks. These instructions provide you with information on caring for yourself after your procedure. Your health care provider may also give you more specific instructions. Your treatment has been planned according to current medical practices, but problems sometimes occur. Call your health care provider if you have any problems or questions after your procedure. WHAT TO EXPECT AFTER THE PROCEDURE After your procedure, it is typical to have the following:  You may have mild cramping and a small amount of vaginal bleeding for a few days after the procedure. This is normal. HOME CARE INSTRUCTIONS  Only take over-the-counter or prescription medicine as directed by your health care provider.  Do not douche, use tampons, or have sexual intercourse until your health care provider approves.  Follow your health care provider's instructions regarding any activity restrictions, such as strenuous exercise or heavy lifting. SEEK MEDICAL CARE IF:  You have heavy bleeding or bleeding longer than 2 days after the procedure.  You have bad smelling drainage from your vagina.  You have a fever and chills.  Youhave severe lower stomach (abdominal) pain. SEEK IMMEDIATE MEDICAL CARE IF:  You have severe cramps in your stomach or back.  You pass large blood clots.  Your bleeding increases.  You become weak or lightheaded, or you pass out. Document Released: 01/12/2013 Document Reviewed: 01/12/2013 Exit[employer] Patient Information 2015 [employer]. This information is not intended to replace advice given to you by your health care provider. Make sure you discuss any questions you have with your health care provider.  

## 2013-12-22 NOTE — Progress Notes (Addendum)
Subjective  Patient here for pelvic ultrasound and endometrial biopsy for irregular cycles.  Had menorrhagia in 2008 and had benign endometrial biopsy at that time.  Did not proceed with hysterectomy at that time due to cost.  Bleeding resolved in 2009 and patient began having spotting only.  Now bleeds if under stress or does any straining type maneuver.  Has monthly menstrual type symptoms but if has bleeding it is only spotting for a day.  [facility] 7/20 was 37.3  Also being followed for vulvar draining lesion in the left groin.  On Bactrim DS. Cultures so far show no bacteria and AFB staining is negative.  Lump is almost nonexistent.  Wound not closed yet.   Objective   See ultrasound below - images and report reviewed with patient.   Uterus - 3 fibroids with largest 5 cm and encroaches on the endometrium.  EMS 9.43 mm. Ovaries normal.  No free fluid.     Vulva - left labia majora with shallow 3 mm open wound.  No fistula or tracking noted. Mild amount of drainage noted.    Procedure - endometrial biopsy and cervical polyp removal.  Consent performed. Speculum place in vagina.  Sterile prep of cervix with Hibiclens.  Polyp noted and removed with ring forceps and sent to pathology.  Paracervical block with 10 cc 1% lidocaine - Lot number [id number], expiration 09/11/14. Pipelle placed to    9      cm without difficulty twice. EMB tssue obtained and sent to pathology separately.  Speculum removed.  No complications.  Assessment  Perimenopausal female. Uterine fibroids.  Left labial infection.  Appears to be responding to Bactrim DS but has not completely resolved.  This is the most normal appearance of the left labia I have noted since I first saw the patient.  AFB culture is pending still.  Positive HR HPV pap.   Plan   Repeat course of Bactrim DS po bid for one week.  Patient will do TB testing through health department.  Follow up endometrial biopsy and cervical polyp  pathology reports. Instructions and precautions given for procedure today.  Return for colposcopy in about 2 weeks.    After visit summary to patient.

## 2013-12-22 NOTE — Telephone Encounter (Signed)
Patient has arrived for appointment today.  Will close encounter.

## 2013-12-26 ENCOUNTER — Telehealth: Payer: Self-pay

## 2013-12-26 ENCOUNTER — Encounter: Payer: Self-pay | Admitting: Obstetrics and Gynecology

## 2013-12-26 LAB — IPS OTHER TISSUE BIOPSY

## 2013-12-26 NOTE — Telephone Encounter (Signed)
Spoke with patient. Advised of message and results as seen below. Patient is agreeable and verbalizes understanding. Patient will call back if bleeding increases, develops fever, pain, nausea, or discharge.  Routing to provider for final review. Patient agreeable to disposition. Will close encounter

## 2013-12-26 NOTE — Telephone Encounter (Signed)
OK if having light bleeding.  Please share with her that her endometrial biopsy and cervical polyp were benign.   I will see her for her colposcopy!

## 2013-12-26 NOTE — Telephone Encounter (Signed)
Spoke with patient at time of incoming call. Patient states that she had EMB last Thursday and is still having spotting. States that she is using 2 panti liners per day. Bleeding is alternating between dark red and pink in color. Patient denies feverse, pain, and discharge. "I just wanted to make sure this was normal." Advised post EMB we do expect some spotting for a couple of days. Advised since it has now been five days that I will need to speak with Dr.Silva and give patient a call back with further recommendations and instructions. Patient is agreeable.

## 2013-12-29 ENCOUNTER — Telehealth: Payer: Self-pay | Admitting: Obstetrics and Gynecology

## 2013-12-29 NOTE — Telephone Encounter (Signed)
Bactroban is OK to use.  I will reassess the area when she is here for the colposcopy.  The colposcopy remains an important part of her evaluation.  I will also ask Dr. Sabra Heck to visualize the area with me when she comes if for the colposcopy visit.  Her culture for tuberculosis in the area is still pending but the gram stain for tuberculosis is negative.   If the fistulous area continues, the possibility for an excision of the area exists.

## 2013-12-29 NOTE — Telephone Encounter (Signed)
Spoke with patient. Patient states that she is on her second week of bactrim and her wound has not yet closed. "It does not appear to be smaller than last week. It is still opened. It is not bothering me but it just hasn't closed yet." Patient would like to know what she needs to do now. "I have an ointment I was given when this all started. Should I start using that?" Patient was given bactroban. Advised patient would speak with Dr.Silva about what needs to be done from here and give her a call back with further recommendations and instructions. Patient agreeable.

## 2013-12-29 NOTE — Telephone Encounter (Signed)
Spoke with patient. Advised of message as seen below from Dr.Silva. Patient is agreeable and verbalizes understanding.  Routing to provider for final review. Patient agreeable to disposition. Will close encounter.  

## 2013-12-29 NOTE — Telephone Encounter (Signed)
Patient is on the last day of her medication for an infection. Patient says "the wound has not closed".

## 2014-01-11 ENCOUNTER — Ambulatory Visit (INDEPENDENT_AMBULATORY_CARE_PROVIDER_SITE_OTHER): Payer: 59 | Admitting: Obstetrics and Gynecology

## 2014-01-11 ENCOUNTER — Encounter: Payer: Self-pay | Admitting: Obstetrics and Gynecology

## 2014-01-11 VITALS — BP 120/82 | HR 76 | Ht 64.75 in | Wt 300.0 lb

## 2014-01-11 DIAGNOSIS — Z1239 Encounter for other screening for malignant neoplasm of breast: Secondary | ICD-10-CM

## 2014-01-11 DIAGNOSIS — R87618 Other abnormal cytological findings on specimens from cervix uteri: Secondary | ICD-10-CM

## 2014-01-11 DIAGNOSIS — L659 Nonscarring hair loss, unspecified: Secondary | ICD-10-CM

## 2014-01-11 DIAGNOSIS — N926 Irregular menstruation, unspecified: Secondary | ICD-10-CM

## 2014-01-11 DIAGNOSIS — R87810 Cervical high risk human papillomavirus (HPV) DNA test positive: Secondary | ICD-10-CM

## 2014-01-11 LAB — TSH: TSH: 1.455 u[IU]/mL (ref 0.350–4.500)

## 2014-01-11 MED ORDER — NORETHINDRONE 0.35 MG PO TABS: 1.0000 | ORAL_TABLET | Freq: Every day | ORAL | Status: DC

## 2014-01-11 NOTE — Patient Instructions (Addendum)
Norethindrone tablets (contraception) What is this medicine? NORETHINDRONE (nor eth IN drone) is an oral contraceptive. The product contains a female hormone known as a progestin. It is used to prevent pregnancy. This medicine may be used for other purposes; ask your health care provider or pharmacist if you have questions. COMMON BRAND NAME(S): Camila, Deblitane 28-Day, Errin, Heather, [place], [place], [place], Nor-QD, Nora-BE, Norlyroc, Ortho Micronor, [employer] 28-Day What should I tell my health care provider before I take this medicine? They need to know if you have any of these conditions: -blood vessel disease or blood clots -breast, cervical, or vaginal cancer -diabetes -heart disease -kidney disease -liver disease -mental depression -migraine -seizures -stroke -vaginal bleeding -an unusual or allergic reaction to norethindrone, other medicines, foods, dyes, or preservatives -pregnant or trying to get pregnant -breast-feeding How should I use this medicine? Take this medicine by mouth with a glass of water. You may take it with or without food. Follow the directions on the prescription label. Take this medicine at the same time each day and in the order directed on the package. Do not take your medicine more often than directed. Contact your pediatrician regarding the use of this medicine in children. Special care may be needed. This medicine has been used in female children who have started having menstrual periods. A patient package insert for the product will be given with each prescription and refill. Read this sheet carefully each time. The sheet may change frequently. Overdosage: If you think you have taken too much of this medicine contact a poison control center or emergency room at once. NOTE: This medicine is only for you. Do not share this medicine with others. What if I miss a dose? Try not to miss a dose. Every time you miss a dose or take a dose late your chance of  pregnancy increases. When 1 pill is missed (even if only 3 hours late), take the missed pill as soon as possible and continue taking a pill each day at the regular time (use a back up method of birth control for the next 48 hours). If more than 1 dose is missed, use an additional birth control method for the rest of your pill pack until menses occurs. Contact your health care professional if more than 1 dose has been missed. What may interact with this medicine? Do not take this medicine with any of the following medications: -amprenavir or fosamprenavir -bosentan This medicine may also interact with the following medications: -antibiotics or medicines for infections, especially rifampin, rifabutin, rifapentine, and griseofulvin, and possibly penicillins or tetracyclines -aprepitant -barbiturate medicines, such as phenobarbital -carbamazepine -felbamate -modafinil -oxcarbazepine -phenytoin -ritonavir or other medicines for HIV infection or AIDS -St. John's wort -topiramate This list may not describe all possible interactions. Give your health care provider a list of all the medicines, herbs, non-prescription drugs, or dietary supplements you use. Also tell them if you smoke, drink alcohol, or use illegal drugs. Some items may interact with your medicine. What should I watch for while using this medicine? Visit your doctor or health care professional for regular checks on your progress. You will need a regular breast and pelvic exam and Pap smear while on this medicine. Use an additional method of birth control during the first cycle that you take these tablets. If you have any reason to think you are pregnant, stop taking this medicine right away and contact your doctor or health care professional. If you are taking this medicine for hormone related problems, it  may take several cycles of use to see improvement in your condition. This medicine does not protect you against HIV infection (AIDS)  or any other sexually transmitted diseases. What side effects may I notice from receiving this medicine? Side effects that you should report to your doctor or health care professional as soon as possible: -breast tenderness or discharge -pain in the abdomen, chest, groin or leg -severe headache -skin rash, itching, or hives -sudden shortness of breath -unusually weak or tired -vision or speech problems -yellowing of skin or eyes Side effects that usually do not require medical attention (report to your doctor or health care professional if they continue or are bothersome): -changes in sexual desire -change in menstrual flow -facial hair growth -fluid retention and swelling -headache -irritability -nausea -weight gain or loss This list may not describe all possible side effects. Call your doctor for medical advice about side effects. You may report side effects to FDA at 1-800-FDA-1088. Where should I keep my medicine? Keep out of the reach of children. Store at room temperature between 15 and 30 degrees C (59 and 86 degrees F). Throw away any unused medicine after the expiration date. NOTE: This sheet is a summary. It may not cover all possible information. If you have questions about this medicine, talk to your doctor, pharmacist, or health care provider.  2015, Elsevier/Gold Standard. (2011-12-12 16:41:35)  Colposcopy Care After Colposcopy is a procedure in which a special tool is used to magnify the surface of the cervix. A tissue sample (biopsy) may also be taken. This sample will be looked at for cervical cancer or other problems. After the test:  You may have some cramping.  Lie down for a few minutes if you feel lightheaded.   You may have some bleeding which should stop in a few days. HOME CARE  Do not have sex or use tampons for 2 to 3 days or as told.  Only take medicine as told by your doctor.  Continue to take your birth control pills as usual. Finding out the  results of your test Ask when your test results will be ready. Make sure you get your test results. GET HELP RIGHT AWAY IF:  You are bleeding a lot or are passing blood clots.  You develop a fever of 102 F (38.9 C) or higher.  You have abnormal vaginal discharge.  You have cramps that do not go away with medicine.  You feel lightheaded, dizzy, or pass out (faint). MAKE SURE YOU:   Understand these instructions.  Will watch your condition.  Will get help right away if you are not doing well or get worse. Document Released: 09/10/2007 Document Revised: 06/16/2011 Document Reviewed: 10/21/2012 [facility] Patient Information 2015 [place], [place]. This information is not intended to replace advice given to you by your health care provider. Make sure you discuss any questions you have with your health care provider.

## 2014-01-11 NOTE — Progress Notes (Signed)
Subjective:     Patient ID: [name], female   DOB: 07-11-1960, 53 y.o.   MRN: [id number]  HPI Patient is here for colposcopy for normal pap and positive HR HPV.   Also notes hair loss.   Status post endometrial biopsy which was benign and done for irregular bleeding. LMP - spotting around the 8th of the month.  No spotting this month.  Spotting with stress and exercise.   Patient has also been followed for left vulvar cellulitis and a fistulous drainage which has been slow to respond to antibiotics.  She is also status post I and D.  Had repeat cultures all of which have been negative.  AFB gram stain is negative and AFB culture is still in process.  Patient states that the skin has finally closed and is no longer draining.   Anxious to start exercising again.   Review of Systems    See above.  Objective:   Physical Exam  Genitourinary:            Assessment:     Normal pap and positive HR HPV.  Irregular menses in perimenopausal woman.  Normal endometrial biopsy.  Risk for endometrial hyperplasia due to obesity.  Vulvar cellulitis and fistula resolved.     Plan:     Follow up colposcopic biopsies. I anticipate pap and HPV testing in one year. Check TSH. Start [place] after mammogram and normal result obtained.  Discussed proper use, side effects, and rationale for treatment for prevention of endometrial hyperplasia.  Will do this instead of Provera treatment.  Continue reduced activity for the next week and then can resume exercise (due to only recent healing of vulva.)  20 minutes additional face to face time discussing hair loss, menstrual irregularity, and vulvar cellulitis and fistula.   After visit summary to patient.

## 2014-01-16 LAB — IPS OTHER TISSUE BIOPSY

## 2014-01-18 ENCOUNTER — Ambulatory Visit: Payer: Managed Care, Other (non HMO)

## 2014-01-20 ENCOUNTER — Ambulatory Visit
Admission: RE | Admit: 2014-01-20 | Discharge: 2014-01-20 | Disposition: A | Discharge status: Home / Self Care | Payer: 59 | Source: Ambulatory Visit | Attending: Obstetrics and Gynecology | Admitting: Obstetrics and Gynecology

## 2014-01-20 DIAGNOSIS — Z1239 Encounter for other screening for malignant neoplasm of breast: Secondary | ICD-10-CM

## 2014-01-26 ENCOUNTER — Telehealth: Payer: Self-pay | Admitting: Obstetrics and Gynecology

## 2014-01-26 ENCOUNTER — Telehealth: Payer: Self-pay

## 2014-01-26 NOTE — Telephone Encounter (Signed)
Patient says she is returning a call to a nurse? No telephone note.

## 2014-01-26 NOTE — Telephone Encounter (Signed)
Left message to call Joe Gee at [phone]. 

## 2014-01-26 NOTE — Telephone Encounter (Signed)
Patient called stating she had deleted my voice message from her phone accidentally and wasn't sure why I had called.  Explained to patient I called to notify mammogram was normal and she could begin progesterone only OCP's now.

## 2014-01-27 NOTE — Telephone Encounter (Signed)
Lowella Fairy, CMA at 01/26/2014 2:30 PM     Status: Signed        Patient called stating she had deleted my voice message from her phone accidentally and wasn't sure why I had called. Explained to patient I called to notify mammogram was normal and she could begin progesterone only OCP's now.

## 2014-01-27 NOTE — Telephone Encounter (Signed)
Patient disscussed message with CMA Marisa Sprinkles after initial phone message sent to triage RN. Will close encounter. Routing to provider for final review. Patient agreeable to disposition. Will close encounter

## 2014-01-28 LAB — AFB CULTURE WITH SMEAR (NOT AT ARMC): ACID FAST SMEAR: NONE SEEN

## 2014-02-06 ENCOUNTER — Encounter: Payer: Self-pay | Admitting: Obstetrics and Gynecology

## 2014-04-14 ENCOUNTER — Ambulatory Visit: Payer: 59 | Admitting: Obstetrics and Gynecology

## 2014-05-15 ENCOUNTER — Telehealth: Payer: Self-pay | Admitting: Obstetrics and Gynecology

## 2014-05-15 NOTE — Telephone Encounter (Signed)
Patient called back and rescheduled for 08/09/14 due to not starting the medication yet.

## 2014-05-15 NOTE — Telephone Encounter (Signed)
Patient pressed "cancel" at her reminder call for her visit 05/17/14 with Dr. Quincy Simmonds for a three month recheck. I called her and left a message to call back to reschedule.

## 2014-05-15 NOTE — Telephone Encounter (Signed)
Dr. Quincy Simmonds,  Patient is just now beginning Micronor and rescheduled 3 month f/u for 08-09-14, okay?  Routed to Dr. Quincy Simmonds.

## 2014-05-16 NOTE — Telephone Encounter (Signed)
OK for visit in May 2016.  Encounter closed.

## 2014-05-17 ENCOUNTER — Ambulatory Visit: Payer: Self-pay | Admitting: Obstetrics and Gynecology

## 2014-07-26 ENCOUNTER — Encounter: Payer: Self-pay | Admitting: Obstetrics and Gynecology

## 2014-08-04 ENCOUNTER — Telehealth: Payer: Self-pay | Admitting: Obstetrics and Gynecology

## 2014-08-04 NOTE — Telephone Encounter (Signed)
Patient canceled appointment 08/09/14 (3 mo reck) and states she does not want to reschedule.

## 2014-08-07 NOTE — Telephone Encounter (Signed)
Thank you for the update.  Encounter closed. 

## 2014-08-07 NOTE — Telephone Encounter (Signed)
Called patient at [phone] because patient cancelled 3 month f/u appointment.  Patient states she never began the Micronor and therefore cancelled appointment.  She states she just hasn't gotten it in her "routine" to take and knew there was no reason to return until she had taken it for 3 months.  Advised patient I will let Dr. Quincy Simmonds know.  Routed to Dr. Quincy Simmonds.

## 2014-08-09 ENCOUNTER — Ambulatory Visit: Payer: Self-pay | Admitting: Obstetrics and Gynecology

## 2014-09-12 ENCOUNTER — Telehealth: Payer: Self-pay | Admitting: Obstetrics and Gynecology

## 2014-09-12 NOTE — Telephone Encounter (Signed)
Left a message to reschedule AEX with Dr. Quincy Simmonds first in AM or last in PM. See if 11/30/14 is still open.

## 2014-09-13 NOTE — Telephone Encounter (Signed)
Coming 11/30/14.

## 2014-10-26 ENCOUNTER — Ambulatory Visit: Payer: 59 | Admitting: Nurse Practitioner

## 2014-10-26 ENCOUNTER — Ambulatory Visit: Payer: Self-pay | Admitting: Certified Nurse Midwife

## 2014-11-30 ENCOUNTER — Ambulatory Visit: Payer: Self-pay | Admitting: Obstetrics and Gynecology

## 2014-12-12 ENCOUNTER — Other Ambulatory Visit (HOSPITAL_COMMUNITY)
Admission: RE | Admit: 2014-12-12 | Discharge: 2014-12-12 | Disposition: A | Discharge status: Home / Self Care | Payer: Self-pay | Source: Ambulatory Visit | Attending: Obstetrics and Gynecology | Admitting: Obstetrics and Gynecology

## 2014-12-12 ENCOUNTER — Other Ambulatory Visit: Payer: Self-pay | Admitting: Obstetrics and Gynecology

## 2014-12-12 DIAGNOSIS — Z1151 Encounter for screening for human papillomavirus (HPV): Secondary | ICD-10-CM | POA: Insufficient documentation

## 2014-12-12 DIAGNOSIS — Z01411 Encounter for gynecological examination (general) (routine) with abnormal findings: Secondary | ICD-10-CM | POA: Insufficient documentation

## 2014-12-13 LAB — CYTOLOGY - PAP

## 2015-01-04 ENCOUNTER — Telehealth: Payer: Self-pay | Admitting: *Deleted

## 2015-01-04 NOTE — Telephone Encounter (Signed)
I agree.  Encounter closed.

## 2015-01-04 NOTE — Telephone Encounter (Signed)
08 Pap recall due 10/2014 due to + HR HPV on previous pap  Patient has transferred care.  Release of Information on file and records have been sent.    Removing patient from current recall.  Routing to provider for final review.

## 2016-03-24 ENCOUNTER — Other Ambulatory Visit: Payer: Self-pay | Admitting: Family Medicine

## 2016-03-24 DIAGNOSIS — Z1231 Encounter for screening mammogram for malignant neoplasm of breast: Secondary | ICD-10-CM

## 2016-03-26 ENCOUNTER — Ambulatory Visit
Admission: RE | Admit: 2016-03-26 | Discharge: 2016-03-26 | Disposition: A | Discharge status: Home / Self Care | Payer: BLUE CROSS/BLUE SHIELD | Source: Ambulatory Visit | Attending: Family Medicine | Admitting: Family Medicine

## 2016-03-26 DIAGNOSIS — Z1231 Encounter for screening mammogram for malignant neoplasm of breast: Secondary | ICD-10-CM

## 2018-05-21 ENCOUNTER — Other Ambulatory Visit: Payer: Self-pay | Admitting: Obstetrics and Gynecology

## 2018-05-21 ENCOUNTER — Other Ambulatory Visit (HOSPITAL_COMMUNITY)
Admission: RE | Admit: 2018-05-21 | Discharge: 2018-05-21 | Disposition: A | Discharge status: Home / Self Care | Payer: BC Managed Care – PPO | Source: Ambulatory Visit | Attending: Obstetrics and Gynecology | Admitting: Obstetrics and Gynecology

## 2018-05-21 DIAGNOSIS — Z01419 Encounter for gynecological examination (general) (routine) without abnormal findings: Secondary | ICD-10-CM | POA: Insufficient documentation

## 2018-05-25 LAB — CYTOLOGY - PAP
Adequacy: ABSENT
DIAGNOSIS: NEGATIVE
HPV: NOT DETECTED

## 2019-06-16 ENCOUNTER — Encounter: Payer: Self-pay | Admitting: Registered"

## 2019-06-23 ENCOUNTER — Ambulatory Visit: Payer: BLUE CROSS/BLUE SHIELD | Attending: Family

## 2019-06-23 DIAGNOSIS — Z23 Encounter for immunization: Secondary | ICD-10-CM

## 2019-06-23 NOTE — Progress Notes (Signed)
   Covid-19 Vaccination Clinic  Name:  [name]    MRN: [id number] DOB: 10-11-1960  06/23/2019  [name] was observed post Covid-19 immunization for 15 minutes without incident. She was provided with Vaccine Information Sheet and instruction to access the V-Safe system.   [name] was instructed to call 911 with any severe reactions post vaccine: Marland Kitchen Difficulty breathing  . Swelling of face and throat  . A fast heartbeat  . A bad rash all over body  . Dizziness and weakness   Immunizations Administered    Name Date Dose VIS Date Route   Moderna COVID-19 Vaccine 06/23/2019 10:51 AM 0.5 mL 03/08/2019 Intramuscular   Manufacturer: Moderna   Lot: [id number]   [id number]

## 2019-07-26 ENCOUNTER — Ambulatory Visit: Payer: BLUE CROSS/BLUE SHIELD | Attending: Family

## 2019-07-26 DIAGNOSIS — Z23 Encounter for immunization: Secondary | ICD-10-CM

## 2019-07-26 NOTE — Progress Notes (Signed)
   Covid-19 Vaccination Clinic  Name:  [name]    MRN: [id number] DOB: 03/05/1961  07/26/2019  Ms. [name] was observed post Covid-19 immunization for 15 minutes without incident. She was provided with Vaccine Information Sheet and instruction to access the V-Safe system.   [name] was instructed to call 911 with any severe reactions post vaccine: Marland Kitchen Difficulty breathing  . Swelling of face and throat  . A fast heartbeat  . A bad rash all over body  . Dizziness and weakness   Immunizations Administered    Name Date Dose VIS Date Route   Moderna COVID-19 Vaccine 07/26/2019 10:00 AM 0.5 mL 03/2019 Intramuscular   Manufacturer: Moderna   Lot: [id number]   [id number]

## 2019-08-25 ENCOUNTER — Other Ambulatory Visit: Payer: Self-pay

## 2019-08-25 ENCOUNTER — Encounter: Payer: Self-pay | Admitting: Registered"

## 2019-08-25 ENCOUNTER — Encounter: Payer: BC Managed Care – PPO | Attending: Family Medicine | Admitting: Registered"

## 2019-08-25 DIAGNOSIS — E7800 Pure hypercholesterolemia, unspecified: Secondary | ICD-10-CM | POA: Insufficient documentation

## 2019-08-25 NOTE — Progress Notes (Signed)
Medical Nutrition Therapy:  Appt start time: 8:15 end time:  9:20.   Assessment:  Primary concerns today: Per referral, pts recent labs reveal elevated cholesterol (223).  Pt expectations: understand what to put in her body. States she needs support.   Pt states she has annual physicals her recent physical indicated she may be getting closer to having hypertension and diabetes. Reports she has never taken medications for heath concerns and does not want to start. States she had initially signed up for weight loss study and eventually decided not to go through with it. States she felt she had no other options because she had tried so many other things to lose weight and they didn't work.   States she used to be athletic and fit; tennis, kickboxing, cross training, and walking. States she had started running.  States she has phases of doing the right thing. States she has a recumbent bike at home but it needs to be replaced. States she has back pain limits movement.   States she is either really good with exercise or food but cannot get them to stay together. States she stopped adding sugar to her regimen although she loves them, such as cakes, cookies, ice cream. States she has moments when she wants to have something to eat around 3 pm. Reports taking protein bars, pistachios, cheese crackers as snack options to work. States she knows what to do and was doing it prior to starting keto. States she has been restricting carbohydrates.  States she typically sleeps about 4 hrs/night. Recently, has been sleeping 7-8 hrs/night while sister is in town visiting & helping with caretaking for pt's mom. Pt and mom live together.    Preferred Learning Style:   No preference indicated   Learning Readiness:   Ready  Change in progress   MEDICATIONS: See list   DIETARY INTAKE:  Usual eating pattern includes 3 meals and 1-2 snacks per day.  Everyday foods include salad, sandwiches.  Avoided foods  include soda, sugary items (cakes, cookies, pies), bread, pasta (sometimes), carbohydrates.    24-hr recall:  B ( AM): coffee (with creamer, caramel syrup) + oatmeal + raisins   Snk ( AM):   L ( PM): [employer]-salad with chicken and apples, pecan Snk ( PM): sometimes cookies D ( PM): chicken, shrimp, vegetable soup + beef patty + cocoa bread  Snk ( PM): italian ice Beverages: water (50-60 oz), coffee  Usual physical activity: sometimes core work, dancing  Estimated energy needs: 1600-1800 calories 180-200 g carbohydrates 120-135 g protein 44-50 g fat  Progress Towards Goal(s):  In progress.   Nutritional Diagnosis:  NB-1.1 Food and nutrition-related knowledge deficit As related to hypercholesterolemia.  As evidenced by verbalized incomplete information.    Intervention:  Nutrition education and couseling. Pt was educated and counseled on the benefits of listening to body related to hunger and satiety cues, high cholesterol, nutritional ways to lower cholesterol, ways to increase fiber intake, and ways to increase physical activity. Pt was encouraged to continue having 3 meals/day and staying hydrated. Pt was in agreement with goals listed.  Goals: - Aim to add in afternoon snack to include carbohydrates + protein such as peanut butter crackers, cheese and crackers, fruit and nuts/nut butter, protein bar, or trail mix.  - Choose whole grains when choosing bread options.   Teaching Method Utilized:  Visual Auditory Hands on  Handouts given during visit include:  Cholesterol-Lowering Nutrition Therapy  Barriers to learning/adherence to lifestyle change: none identified  Demonstrated degree of understanding via:  Teach Back   Monitoring/Evaluation:  Dietary intake, exercise, and body weight in 1 month(s).

## 2019-08-25 NOTE — Patient Instructions (Signed)
-   Aim to add in afternoon snack to include carbohydrates + protein such as peanut butter crackers, cheese and crackers, fruit and nuts/nut butter, protein bar, or trail mix.   - Choose whole grains when choosing bread options.

## 2019-09-29 ENCOUNTER — Ambulatory Visit: Payer: BLUE CROSS/BLUE SHIELD | Admitting: Registered"

## 2019-11-08 ENCOUNTER — Ambulatory Visit: Payer: BLUE CROSS/BLUE SHIELD | Admitting: Registered"

## 2020-02-13 ENCOUNTER — Ambulatory Visit: Payer: Self-pay | Attending: Internal Medicine

## 2020-02-13 DIAGNOSIS — Z23 Encounter for immunization: Secondary | ICD-10-CM

## 2020-02-13 NOTE — Progress Notes (Signed)
   Covid-19 Vaccination Clinic  Name:  [name]    MRN: [id number] DOB: 1960/09/17  02/13/2020  [name] was observed post Covid-19 immunization for 15 minutes without incident. She was provided with Vaccine Information Sheet and instruction to access the V-Safe system.   [name] was instructed to call 911 with any severe reactions post vaccine: Marland Kitchen Difficulty breathing  . Swelling of face and throat  . A fast heartbeat  . A bad rash all over body  . Dizziness and weakness

## 2020-03-13 ENCOUNTER — Ambulatory Visit: Payer: BLUE CROSS/BLUE SHIELD | Admitting: Podiatry

## 2020-07-05 ENCOUNTER — Other Ambulatory Visit: Payer: Self-pay

## 2020-07-05 ENCOUNTER — Encounter: Payer: BC Managed Care – PPO | Attending: Family Medicine | Admitting: Registered"

## 2020-07-05 ENCOUNTER — Encounter: Payer: Self-pay | Admitting: Registered"

## 2020-07-05 DIAGNOSIS — E7800 Pure hypercholesterolemia, unspecified: Secondary | ICD-10-CM | POA: Insufficient documentation

## 2020-07-05 NOTE — Progress Notes (Signed)
  Medical Nutrition Therapy:  Appt start time: 8:08 end time: 8:49   Assessment:  Primary concerns today: Per referral, pts recent labs reveal elevated cholesterol (223).  Pt expectations: understand what to put in her body. States she needs support.   States she started intermittent fasting in Jan 2022. Reports she wanted to lose 40 lbs in a week. States she has lost 12 lbs in 3 months but feels like she is stuck. States she feels better and has been more mindful. States she wants to create a plan for the next 90 days.  States she eats more sugar and fat in the mornings in her oatmeal and coffee. States she tries to eat a variety.   Stops eating at 8 pm at the latest. Will begin eating at 10-12 pm. Fasts for 16 hours. States she feels she has most control over breakfast. States she is exercising and the pounds are not coming off. States the inches are changing. States she his drinking more water.   Previous appt: Pt states she has annual physicals her recent physical indicated she may be getting closer to having hypertension and diabetes. Reports she has never taken medications for heath concerns and does not want to start.    Preferred Learning Style:   No preference indicated   Learning Readiness:   Ready  Change in progress   MEDICATIONS: See list   DIETARY INTAKE:  Usual eating pattern includes 3 meals and 1-2 snacks per day.  Everyday foods include salad, sandwiches.  Avoided foods include soda, sugary items (cakes, cookies, pies), bread, pasta (sometimes), carbohydrates.    24-hr recall:  B ( AM): coffee (with creamer, caramel syrup) + oatmeal + raisins + walnuts Snk ( AM):   L ( PM): burger (with pickles) + chips  Snk ( PM):  D ( PM): a huge salad (with egg, beef)  Snk ( PM):  Beverages: water (80-90 oz), coffee  Usual physical activity: 30 min cardio (V-shred), bike (HIIT); 6 days/week  Estimated energy needs: 1600-1800 calories 180-200 g carbohydrates 120-135  g protein 44-50 g fat  Progress Towards Goal(s):  In progress.   Nutritional Diagnosis:  NB-1.1 Food and nutrition-related knowledge deficit As related to hypercholesterolemia.  As evidenced by verbalized incomplete information.    Intervention:  Nutrition education and couseling. Pt was encouraged with changes from previous visit such as increased physical activity, increased balance with meals, and increased physical activity. Pt is doing well! Goals: - Keep up the great work with changes from previous appt!   Teaching Method Utilized:  Visual Auditory Hands on  Handouts given during visit include:  none  Barriers to learning/adherence to lifestyle change: none identified  Demonstrated degree of understanding via:  Teach Back   Monitoring/Evaluation:  Dietary intake, exercise, and body weight in 3 month(s).

## 2020-07-05 NOTE — Patient Instructions (Signed)
-   Keep up the great work with changes from previous appt!

## 2020-08-13 ENCOUNTER — Inpatient Hospital Stay: Payer: BC Managed Care – PPO

## 2020-08-13 ENCOUNTER — Inpatient Hospital Stay: Payer: BC Managed Care – PPO | Attending: Family Medicine | Admitting: Genetic Counselor

## 2020-08-13 ENCOUNTER — Other Ambulatory Visit: Payer: Self-pay

## 2020-08-13 DIAGNOSIS — Z8481 Family history of carrier of genetic disease: Secondary | ICD-10-CM

## 2020-08-13 DIAGNOSIS — Z803 Family history of malignant neoplasm of breast: Secondary | ICD-10-CM

## 2020-08-13 LAB — GENETIC SCREENING ORDER

## 2020-08-14 ENCOUNTER — Encounter: Payer: Self-pay | Admitting: Genetic Counselor

## 2020-08-14 DIAGNOSIS — Z803 Family history of malignant neoplasm of breast: Secondary | ICD-10-CM

## 2020-08-14 DIAGNOSIS — Z8481 Family history of carrier of genetic disease: Secondary | ICD-10-CM

## 2020-08-14 HISTORY — DX: Family history of malignant neoplasm of breast: Z80.3

## 2020-08-14 HISTORY — DX: Family history of carrier of genetic disease: Z84.81

## 2020-08-14 NOTE — Progress Notes (Signed)
REFERRING PROVIDER: No referring provider defined for this encounter.  PRIMARY PROVIDER:  Tommy Medal, MD  PRIMARY REASON FOR VISIT:  1. Family history of breast cancer   2. Family history of BRCA2 gene positive     HISTORY OF PRESENT ILLNESS:   Ms. [name], a 60 y.o. female, was seen for a [place] cancer genetics consultation due to a family history of breast cancer and a family history of a BRCA2 mutation in her mother.  Ms. Kopke presents to clinic today to discuss the possibility of a hereditary predisposition to cancer, to discuss genetic testing, and to further clarify her future cancer risks, as well as potential cancer risks for family members.    Ms. Ringle is a 60 y.o. female with no personal history of cancer.    CANCER HISTORY:  Oncology History   No history exists.    RISK FACTORS:  Menarche was at age 89 or 6.  Nulliparous.  OCP use for approximately 15 years.  Ovaries intact: yes.  Hysterectomy: no; hx of abnormal uterine bleeding, seeking eval through Dr. Christophe Louis Menopausal status: peri-post menopausal.  HRT use: 0 years. Colonoscopy: yes; most recent around age 55. Mammogram within the last year: yes. Number of breast biopsies: 0. Up to date with pelvic exams: yes.  Past Medical History:  Diagnosis Date  . Abnormal Pap smear of cervix    repeat pap OK, no  colpo  . Abnormal uterine bleeding    endo biopsy by Dr. Sabra Heck, [place]  . Family history of BRCA2 gene positive 08/14/2020  . Family history of breast cancer 08/14/2020  . Fibroid   . History of malaria   . Vitamin D deficiency     Past Surgical History:  Procedure Laterality Date  . CHOLECYSTECTOMY     laproscopic    Social History   Socioeconomic History  . Marital status: Single    Spouse name: Not on file  . Number of children: 0  . Years of education: Not on file  . Highest education level: Not on file  Occupational History    Employer: [employer]  Tobacco Use   . Smoking status: Never Smoker  . Smokeless tobacco: Never Used  Substance and Sexual Activity  . Alcohol use: No  . Drug use: No  . Sexual activity: Never    Partners: Male    Birth control/protection: Abstinence  Other Topics Concern  . Not on file  Social History Narrative   Currently works at [employer] Strain: Not on file  Food Insecurity: Not on file  Transportation Needs: Not on file  Physical Activity: Not on file  Stress: Not on file  Social Connections: Not on file     FAMILY HISTORY:  We obtained a detailed, 4-generation family history.  Significant diagnoses are listed below: Family History  Problem Relation Age of Onset  . Breast cancer Maternal Grandmother        dx after 50  . Lung cancer Father        smoking hx  . Breast cancer Mother 22       triple negative; BRCA2 mutation  . Kidney cancer Maternal Uncle        dx after 60  . Prostate cancer Maternal Uncle        dx after 50  . Colon cancer Maternal Aunt 65  . Wilm's tumor Nephew 3    Ms.  Texeira has two two sisters, one full brother, and one maternal half brother, all without a history of cancer.  Ms. Schriver's mother, age 35, was diagnosed with triple negative breast cancer at age 30.  She had genetic testing through [facility], which includes sequencing and/or deletion/duplication analysis of the following 84 genes:  AIP*, ALK, APC*, ATM*, AXIN2*, BAP1*, BARD1*, BLM*, BMPR1A*, BRCA1*, BRCA2*, BRIP1*, CASR, CDC73*, CDH1*, CDK4, CDKN1B*, CDKN1C*, CDKN2A, CEBPA, CHEK2*, CTNNA1*, DICER1*, DIS3L2*, EGFR, EPCAM, FH*, FLCN*, GATA2*, GPC3, GREM1, HOXB13, HRAS, KIT, MAX*, MEN1*, MET, MITF, MLH1*, MSH2*, MSH3*, MSH6*, MUTYH*, NBN*, NF1*, NF2*, NTHL1*, PALB2*, PDGFRA, PHOX2B, PMS2*, POLD1*, POLE*, POT1*, PRKAR1A*, PTCH1*, PTEN*, RAD50*, RAD51C*, RAD51D*, RB1*, RECQL4, RET, RUNX1*, SDHA*, SDHAF2*, SDHB*, SDHC*, SDHD*, SMAD4*, SMARCA4*, SMARCB1*,  SMARCE1*, STK11*, SUFU*, TERC, TERT, TMEM127*, Tp53*, TSC1*, TSC2*, VHL*, WRN*, and WT1.  RNA analysis is performed for * genes.  Ms. Folkerts' mother was found to have a pathogenic variant in BRCA2 called c.4284dup (p.[id number]*9).  Her mother also had a variant of uncertain significance in MSH3 called c.2600T>C (p.[id number]).  No other deleterious or uncertain variants were detected in her mother's testing.  Ms. Connery has a maternal uncle with kidney cancer, a maternal uncle with prostate cancer, a maternal aunt with colon cancer, and a maternal grandmother with breast cancer diagnosed after 72.   Ms. Brix's father died at age 51 and had a history of lung cancer.  She had some limited information about her paternal family history but no other cancer was reported.   Ms. Lathon is unaware of previous family history of genetic testing for hereditary cancer risks besides that mentioned above. There is no reported Ashkenazi Jewish ancestry. There is no known consanguinity.  GENETIC COUNSELING ASSESSMENT: Ms. Tangeman is a 60 y.o. female with a family history of a known hereditary cancer syndrome. We, therefore, discussed and recommended the following at today's visit.   DISCUSSION: We discussed that 5 - 10% of cancer is hereditary, with most cases of hereditary breast cancer associated with mutations in BRCA1/2.  We discussed that given her mother has a mutation in BRCA2, there is a 50% chance that she also has the same mutation in BRCA2.  We reviewed the breast, ovarian, pancreatic, melanoma, and other cancer risks associated with BRCA2 mutations and reviewed available management strategies for those in the family with the familial mutation. We discussed that testing is beneficial for several reasons, including knowing about other cancer risks, identifying potential screening and risk-reduction options that may be appropriate, and to understanding if other family members could be at risk for cancer and  allowing them to undergo genetic testing.  We reviewed the characteristics, features and inheritance patterns of hereditary cancer syndromes. We also discussed genetic testing, including the appropriate family members to test, the process of testing, insurance coverage and turn-around-time for results. We discussed the implications of a negative, positive, carrier and/or variant of uncertain significant result.  We recommended Ms. Dondlinger pursue genetic testing for the BRCA2 gene (sequencing and deletion/duplicaiton analysis).  Ms. Foronda genetic testing also included sequencing and deletion/duplication analysis of MSH3 given the variant of uncertain significance detected in her mother in this gene.     Based on Ms. Bonds's family history of breast cancer and family history of BRCA2 mutation, she meets medical criteria for genetic testing.  She should not have an out of pocket cost associated with this test given that [facility] is offering no-charge family variant testing within 150 days of her mother's report date.   We  discussed that some people do not want to undergo genetic testing due to fear of genetic discrimination.  A federal law called the Genetic Information Non-Discrimination Act (GINA) of 2008 helps protect individuals against genetic discrimination based on their genetic test results.  It impacts both health insurance and employment.  With health insurance, it protects against increased premiums, being kicked off insurance or being forced to take a test in order to be insured.  For employment it protects against hiring, firing and promoting decisions based on genetic test results.  GINA does not apply to those in the [employer], those who work for companies with less than 15 employees, and new life insurance or long-term disability insurance policies.  Health status due to a cancer diagnosis is not protected under GINA.  PLAN: After considering the risks, benefits, and limitations, Ms. Rubert  provided informed consent to pursue genetic testing and the blood sample was sent to [facility] for sequencing and deletion/duplication analysis of BRCA2 and MSH3. Results should be available within approximately 3 weeks' time, at which point they will be disclosed by telephone to Ms. Roughton, as will any additional recommendations warranted by these results. Ms. Nordhoff will receive a summary of her genetic counseling visit and a copy of her results once available. This information will also be available in Epic.   Lastly, we encouraged Ms. Muise to remain in contact with cancer genetics annually so that we can continuously update the family history and inform her of any changes in cancer genetics and testing that may be of benefit for this family.   Ms. Liz's questions were answered to her satisfaction today. Our contact information was provided should additional questions or concerns arise. Thank you for the referral and allowing [place] to share in the care of your patient.   Mearl Harewood M. Joette Catching, [place], [facility]: Lambert Rhonda W Genetic Counselor [email or web address] (P) [phone]   The patient was seen for a total of 40 minutes in face-to-face genetic counseling.  Drs. Magrinat, Lindi Adie and/or Burr Medico were available to discuss this case as needed.  _______________________________________________________________________ For Office Staff:  Number of people involved in session: 2 Was an Intern/ student involved with case: no

## 2020-09-05 ENCOUNTER — Encounter: Payer: Self-pay | Admitting: Genetic Counselor

## 2020-09-05 ENCOUNTER — Telehealth: Payer: Self-pay | Admitting: Genetic Counselor

## 2020-09-05 ENCOUNTER — Ambulatory Visit: Payer: Self-pay | Admitting: Genetic Counselor

## 2020-09-05 DIAGNOSIS — Z8481 Family history of carrier of genetic disease: Secondary | ICD-10-CM

## 2020-09-05 DIAGNOSIS — Z803 Family history of malignant neoplasm of breast: Secondary | ICD-10-CM

## 2020-09-05 DIAGNOSIS — Z1379 Encounter for other screening for genetic and chromosomal anomalies: Secondary | ICD-10-CM | POA: Insufficient documentation

## 2020-09-05 DIAGNOSIS — Z1501 Genetic susceptibility to malignant neoplasm of breast: Secondary | ICD-10-CM

## 2020-09-05 DIAGNOSIS — Z1502 Genetic susceptibility to malignant neoplasm of ovary: Secondary | ICD-10-CM

## 2020-09-05 DIAGNOSIS — Z1509 Genetic susceptibility to other malignant neoplasm: Secondary | ICD-10-CM

## 2020-09-05 HISTORY — DX: Genetic susceptibility to malignant neoplasm of breast: Z15.01

## 2020-09-05 HISTORY — DX: Genetic susceptibility to other malignant neoplasm: Z15.09

## 2020-09-05 NOTE — Progress Notes (Signed)
GENETIC TEST RESULTS  Patient Name: [name] Patient Age: 60 y.o. Encounter Date: 09/05/2020  [name] was seen in the [facility] on Aug 13, 2020 due to a family history of cancer and concern regarding a hereditary predisposition to cancer and a family history of a known pathogenic variant in [place] in the family. Please refer to the prior Genetics clinic note for more information regarding [name]'s medical and family histories and our assessment at the time.    FAMILY HISTORY:  We obtained a detailed, 4-generation family history.  Significant diagnoses are listed below:      Family History  Problem Relation Age of Onset  . Breast cancer Maternal Grandmother        dx after 50  . Lung cancer Father        smoking hx  . Breast cancer Mother 39       triple negative; BRCA2 mutation  . Kidney cancer Maternal Uncle        dx after 56  . Prostate cancer Maternal Uncle        dx after 50  . Colon cancer Maternal Aunt 1  . Wilm's tumor Nephew 3    [name] has two two sisters, one full brother, and one maternal half brother, all without a history of cancer.  [name]'s mother, age 28, was diagnosed with triple negative breast cancer at age 17.  She had genetic testing through [facility], which includes sequencing and/or deletion/duplication analysis of the following 84 genes:  AIP*, ALK, APC*, ATM*, AXIN2*, BAP1*, BARD1*, BLM*, BMPR1A*, BRCA1*, BRCA2*, BRIP1*, CASR, CDC73*, CDH1*, CDK4, CDKN1B*, CDKN1C*, CDKN2A, CEBPA, CHEK2*, CTNNA1*, DICER1*, DIS3L2*, EGFR, EPCAM, FH*, FLCN*, GATA2*, GPC3, GREM1, HOXB13, HRAS, KIT, MAX*, MEN1*, MET, MITF, MLH1*, MSH2*, MSH3*, MSH6*, MUTYH*, NBN*, NF1*, NF2*, NTHL1*, PALB2*, PDGFRA, PHOX2B, PMS2*, POLD1*, POLE*, POT1*, PRKAR1A*, PTCH1*, PTEN*, RAD50*, RAD51C*, RAD51D*, RB1*, RECQL4, RET, RUNX1*, SDHA*, SDHAF2*, SDHB*, SDHC*, SDHD*, SMAD4*, SMARCA4*, SMARCB1*, SMARCE1*, STK11*, SUFU*, TERC, TERT, TMEM127*, Tp53*, TSC1*,  TSC2*, VHL*, WRN*, and WT1.  RNA analysis is performed for * genes.  [name]' mother was found to have a pathogenic variant in BRCA2 called c.4284dup (p.Gln1429Serfs*9).  Her mother also had a variant of uncertain significance in MSH3 called c.2600T>C (p.Ile867Thr).  No other deleterious or uncertain variants were detected in her mother's testing.  [name] has a maternal uncle with kidney cancer, a maternal uncle with prostate cancer, a maternal aunt with colon cancer, and a maternal grandmother with breast cancer diagnosed after 82.   [name]'s father died at age 18 and had a history of lung cancer.  She had some limited information about her paternal family history but no other cancer was reported.   [name] is unaware of previous family history of genetic testing for hereditary cancer risks besides that mentioned above. There is no reported Ashkenazi Jewish ancestry. There is no known consanguinity.  GENETIC TESTING: At the time of [name]'s visit, we recommended she pursue genetic testing for the specific BRCA2 mutation that was identified in her mother. 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   The test report will be scanned into EPIC and located under the Molecular Pathology section of the Results Review tab.  A portion of the result report is included below for reference.     Genetic testing did identify a variant of uncertain significance (  VUS) in the MSH3 gene called c.2600T>C (p.Ile867Thr).  At this time, it is unknown if this variant is associated with increased cancer risk or if this is a normal finding, but most variants such as this get reclassified to being inconsequential. It should not be used to make medical management decisions. With time, we suspect the lab will determine the  significance of this variant, if any. If we do learn more about it, we will try to contact [name] to discuss it further. However, it is important to stay in touch with [place] periodically and keep the address and phone number up to date.  CANCER RISKS: Both females and males with a BRCA2 mutation are at an increased risk for cancer. Studies show that females with a BRCA2 mutation can have a 61-77% lifetime risk to develop breast cancer, up to a 26% risk to develop a second breast cancer within 20 years, and up to a 11-25% risk to develop ovarian cancer. Males can have a 7-8% lifetime risk to develop female breast cancer and a 20-30% risk for prostate cancer. Both males and females can also have a 3-5% increased risk for pancreatic cancer and a 3-5% increased risk for melanoma.  CANCER RISK REDUCTION & SCREENING RECOMMENDATIONS: The [place] (NCCN) recommends the following for women who carry BRCA mutations: Breast awareness starting at the age of 79 years; women should report any changes to their breasts to their health care provider. Periodic breast self-exams may facilitate breast self-awareness. Clinical breast exams every 6-12 months, starting at age 82 years Annual breast MRI and annual mammograms starting at age 53 years and 30 years, respectively, or individualized based on age of earliest onset of breast cancer in the family. Consideration of risk reducing mastectomy, which reduces the risk of breast cancer by greater than 90%. Consideration of risk reducing salpingo-oophorectomy (RRSO), ideally between the ages of 65-40, or individualized based on completion of child-bearing years or age of earliest onset of ovarian cancer in the family. RRSO reduces the risk of ovarian cancer by greater than 90% and can reduce the risk of breast cancer by 50% if performed prior to menopause. Women who have undergone risk reducing RRSO have a small risk of peritoneal carcinoma and can  consider annual CA-125 surveillance. For women who still have their ovaries, transvaginal ultrasound and CA-125 testing every 6 months starting at age 33 years, or 5-10 years prior to the earliest age of onset of ovarian cancer in the family can be considered. Studies have not demonstrated that ovarian cancer screening is effective in detecting early ovarian cancer. Consideration of chemoprevention options such as oral contraceptives, Tamoxifen and Raloxifene, if appropriate for an individual.   The following is recommended for both males and females who carry a BRCA2 mutation: Consideration of pancreatic cancer screening if there is a family history of pancreatic cancer.  [name] did not report a family history of pancreatic cancer, thus pancreatic cancer screening is not recommended at this time.  General melanoma risk management, such as annual full-body skin examination and minimizing UV exposure Consider investigational imaging and screening studies, when available (e.g. novel imaging technologies, more frequent screening intervals) in the context of a clinical trial  RISK REDUCTION: There are several things that can be offered to individuals who are carriers for BRCA mutations that will reduce the risk for getting cancer.    The use of oral contraceptives can lower the risk for ovarian cancer, and, per case control studies, does  not significantly increase the risk for breast cancer in BRCA patients.  Case control studies have shown that oral contraceptives can lower the risk for ovarian cancer in women with BRCA mutations. Additionally, a more recent meta-analysis, including one corhort (n=3,181) and one case control study (1,096 cases and 2,878 controls) also showed an inverse correlation between ovarian cancer and ever having used oral contraceptives (OR, 0.58; 95% CI = 0.46-0.73).  Studies on oral contraceptives and breast cancer have been conflicting, with some studies suggesting that there  is not an increased risk for breast cancer in BRCA mutation carriers, while others suggest that there could be a risk.  That said, two meta-analysis studies have shown that there is not an increased risk for breast cancer with oral contraceptive use in BRCA1 and BRCA2 carriers.    In individuals who have a prophylactic bilateral salpino-oophorectomy (BSO), the risk for breast cancer is reduced by up to 50%.  It has been reported that short term hormone replacement therapy in women undergoing prophylactic BSO does not negate the reduction of breast cancer risk associated with surgery (1.2018 NCCN guidelines).  MEDICAL MANAGEMENT: Females who have a BRCA mutation have an increased risk for both breast and ovarian cancer.   As discussed with [name], to reduce the risk for breast cancer, prophylactic bilateral mastectomy is the most effective option for risk reduction. However, for women who choose to keep their breasts intensified screening is equally safe.  We recommend yearly mammograms, yearly breast MRI, twice-yearly clinical breast exams, and monthly self-breast exams.  She will be seen in our high risk clinic to discuss surgical or high risk screening options.  She requested to be seen by Dr. Burr Medico given that her mother is followed by Dr. Burr Medico for her cancer history.   To reduce the risk for ovarian cancer, we recommend [name] have a prophylactic bilateral salpingo-oophorectomy when childbearing is completed, if planned. We discussed that screening with CA-125 blood tests and transvaginal ultrasounds can be done twice per year. However, these tests have not been shown to detect ovarian cancer at an early stage.  [name] will follow up with Dr. Christophe Louis in regards to her ovarian cancer risk.   FAMILY MEMBERS: It is important that all of [name]'s relatives (both males and females) know of the presence of this gene mutation. Site-specific genetic testing can sort out who in the family is  at risk and who is not.    [name]'s siblings have a 50% chance to have inherited this mutation. We recommend they have genetic testing for this same mutation, as identifying the presence of this mutation would allow them to also take advantage of risk-reducing measures.   Additionally, individuals with a pathogenic variant in BRCA2 are carriers of Fanconi anemia. Fanconi anemia is an autosomal recessive disorder that is characterized by bone marrow failure and variable presentation of anomalies, including short stature, abnormal skin pigmentation, abnormal thumbs, malformations of the skeletal and central nervous systems, and developmental delay. Risks for leukemia and early onset solid tumors are significantly elevated. For there to be a risk of Fanconi anemia in offspring, both parents would each have to have a single pathogenic variant in BRCA2; in such a case, the risk of having an affected child is 25%.  SUPPORT AND RESOURCES: If [name] is interested in BRCA-specific information and support, there are two groups, Facing Our Risk (www.facingourrisk.com) and Bright Pink (www.brightpink.org) which some people have found useful. They provide opportunities to  speak with other individuals from high-risk families. To locate genetic counselors in other cities, visit the website of the [employer] (ArtistMovie.se) and Secretary/administrator for a Social worker by zip code.  We encouraged Ms. Woolever to remain in contact with [place] on an annual basis so we can update her personal and family histories, and let her know of advances in cancer genetics that may benefit the family. Our contact number was provided. Ms. Rau's questions were answered to her satisfaction today, and she knows she is welcome to call anytime with additional questions.   Latavius Capizzi M. Joette Catching, [place], [facility] Genetic Counselor [email or web address] (P) [phone]

## 2020-09-05 NOTE — Telephone Encounter (Signed)
Revealed presence of family variant in BRCA2.  Reviewed cancer risks and management strategies for those with BRCA2 mutations.  [name] is interested in a referral to the high risk clinic with Dr. Burr Medico.

## 2020-09-07 ENCOUNTER — Encounter: Payer: Self-pay | Admitting: Genetic Counselor

## 2020-09-11 ENCOUNTER — Telehealth: Payer: Self-pay | Admitting: Hematology

## 2020-09-11 NOTE — Telephone Encounter (Signed)
Scheduled appt per 6/3 sch msg. Pt aware. Pt requested to see Dr. Burr Medico same time as sister Asencion Partridge due to transportation issues.

## 2020-09-14 ENCOUNTER — Telehealth: Payer: Self-pay | Admitting: Hematology

## 2020-09-14 ENCOUNTER — Encounter: Payer: BC Managed Care – PPO | Admitting: Hematology

## 2020-09-14 NOTE — Telephone Encounter (Signed)
R/s appts per RN Karen's secure chat that pt has Covid. R/s appts to third week in July per pt's request. Pt and sister will be seen at the same time, okay per MD.

## 2020-10-16 ENCOUNTER — Ambulatory Visit: Payer: BLUE CROSS/BLUE SHIELD | Admitting: Registered"

## 2020-10-25 ENCOUNTER — Other Ambulatory Visit: Payer: Self-pay

## 2020-10-25 ENCOUNTER — Inpatient Hospital Stay: Payer: BC Managed Care – PPO | Attending: Family Medicine | Admitting: Hematology

## 2020-10-25 ENCOUNTER — Encounter: Payer: Self-pay | Admitting: Hematology

## 2020-10-25 VITALS — BP 129/65 | HR 82 | Temp 98.8°F | Resp 19 | Ht 64.75 in | Wt 306.5 lb

## 2020-10-25 DIAGNOSIS — Z800 Family history of malignant neoplasm of digestive organs: Secondary | ICD-10-CM | POA: Diagnosis not present

## 2020-10-25 DIAGNOSIS — E669 Obesity, unspecified: Secondary | ICD-10-CM

## 2020-10-25 DIAGNOSIS — Z809 Family history of malignant neoplasm, unspecified: Secondary | ICD-10-CM

## 2020-10-25 DIAGNOSIS — Z803 Family history of malignant neoplasm of breast: Secondary | ICD-10-CM | POA: Diagnosis not present

## 2020-10-25 DIAGNOSIS — Z8051 Family history of malignant neoplasm of kidney: Secondary | ICD-10-CM | POA: Diagnosis not present

## 2020-10-25 DIAGNOSIS — Z1509 Genetic susceptibility to other malignant neoplasm: Secondary | ICD-10-CM | POA: Diagnosis not present

## 2020-10-25 DIAGNOSIS — Z1502 Genetic susceptibility to malignant neoplasm of ovary: Secondary | ICD-10-CM | POA: Diagnosis not present

## 2020-10-25 DIAGNOSIS — Z801 Family history of malignant neoplasm of trachea, bronchus and lung: Secondary | ICD-10-CM | POA: Diagnosis not present

## 2020-10-25 DIAGNOSIS — Z1501 Genetic susceptibility to malignant neoplasm of breast: Secondary | ICD-10-CM

## 2020-10-25 NOTE — Progress Notes (Signed)
[place]   Telephone:([phone]) [phone] Fax:([phone]) [place] Note   Patient Care Team: Tommy Medal, MD as PCP - General (Internal Medicine) 10/25/2020  CHIEF COMPLAINTS/PURPOSE OF CONSULTATION:  BRCA2 MUTATION (+)   Referral physician: Genetic counselor CarI Koerner  HISTORY OF PRESENTING ILLNESS:  [name] 60 y.o. female is here because of her recently discovered BRCA2 mutation.  She is here with her sister Asencion Partridge.  Of note, the mother is also my patient who had a breast cancer.  Due to her positive family history, she underwent genetic testing in May 2022, which came back positive for a single, heterozygous pathologic genetic mutation in BRCA2 c.4284dup (p.Gln1429Serfs*9).  She was referred to my clinic to discuss cancer risk reduction.  She had no history of any cancer.  She has no other chronic medical comorbidities, except obesity.  She has been doing mammograms yearly, which has been normal.  She feels well today, denies any pain or other symptoms.  GYN HISTORY  Menarchal: 11 LMP: spotting  Contraceptive: 10 years  HRT: none  G1P0:   MEDICAL HISTORY:  Past Medical History:  Diagnosis Date   Abnormal Pap smear of cervix    repeat pap [place], no  colpo   Abnormal uterine bleeding    endo biopsy by Dr. Sabra Heck, [place]   BRCA2 gene mutation positive in female 09/05/2020   Family history of BRCA2 gene positive 08/14/2020   Family history of breast cancer 08/14/2020   Fibroid    History of malaria    Vitamin D deficiency     SURGICAL HISTORY: Past Surgical History:  Procedure Laterality Date   CHOLECYSTECTOMY     laproscopic    SOCIAL HISTORY: Social History   Socioeconomic History   Marital status: Single    Spouse name: Not on file   Number of children: 0   Years of education: Not on file   Highest education level: Not on file  Occupational History    Employer: [employer]  Tobacco Use   Smoking status: Never    Smokeless tobacco: Never  Substance and Sexual Activity   Alcohol use: No   Drug use: No   Sexual activity: Never    Partners: Male    Birth control/protection: Abstinence  Other Topics Concern   Not on file  Social History Narrative   Currently works at [employer] Strain: Not on file  Food Insecurity: Not on file  Transportation Needs: Not on file  Physical Activity: Not on file  Stress: Not on file  Social Connections: Not on file  Intimate Partner Violence: Not on file    FAMILY HISTORY: Family History  Problem Relation Age of Onset   Breast cancer Maternal Grandmother        dx after 63   Lung cancer Father        smoking hx   Diabetes Mother    Cancer Mother        melanoma   Breast cancer Mother 57       triple negative; BRCA2 mutation   Diabetes Sister    Other Sister        seizure   Diabetes Maternal Uncle    Kidney cancer Maternal Uncle        dx after 50   Diabetes Maternal Grandfather    Diabetes Maternal Uncle    Prostate cancer Maternal Uncle  dx after 32   Epilepsy Cousin    Hypertension Other    Colon cancer Maternal Aunt 65   Wilm's tumor Nephew 3    ALLERGIES:  is allergic to shrimp [shellfish allergy].  MEDICATIONS:  Current Outpatient Medications  Medication Sig Dispense Refill   BIOTIN PO Take 1 tablet by mouth daily.     Cholecalciferol (VITAMIN D-3) 5000 UNITS TABS Take 1 tablet by mouth daily.     GLUCOSAMINE HCL PO Take by mouth.       ibuprofen (ADVIL,MOTRIN) 200 MG tablet Take 200 mg by mouth every 6 (six) hours as needed.     MAGNESIUM PO Take by mouth.     Multiple Vitamin (MULTIVITAMIN) tablet Take 1 tablet by mouth daily.     norethindrone (MICRONOR,CAMILA,ERRIN) 0.35 MG tablet Take 1 tablet (0.35 mg total) by mouth daily. (Patient not taking: Reported on 08/25/2019) 1 Package 2   UNKNOWN TO PATIENT B vitamin.  Unsure which one.     No current  facility-administered medications for this visit.    REVIEW OF SYSTEMS:   Constitutional: Denies fevers, chills or abnormal night sweats Eyes: Denies blurriness of vision, double vision or watery eyes Ears, nose, mouth, throat, and face: Denies mucositis or sore throat Respiratory: Denies cough, dyspnea or wheezes Cardiovascular: Denies palpitation, chest discomfort or lower extremity swelling Gastrointestinal:  Denies nausea, heartburn or change in bowel habits Skin: Denies abnormal skin rashes Lymphatics: Denies new lymphadenopathy or easy bruising Neurological:Denies numbness, tingling or new weaknesses Behavioral/Psych: Mood is stable, no new changes  All other systems were reviewed with the patient and are negative.  PHYSICAL EXAMINATION: ECOG PERFORMANCE STATUS: 0 - Asymptomatic  Vitals:   10/25/20 0913  BP: 129/65  Pulse: 82  Resp: 19  Temp: 98.8 F (37.1 C)  SpO2: 100%   Filed Weights   10/25/20 0913  Weight: (!) 306 lb 8 oz (139 kg)    GENERAL:alert, no distress and comfortable SKIN: skin color, texture, turgor are normal, no rashes or significant lesions EYES: normal, conjunctiva are pink and non-injected, sclera clear OROPHARYNX:no exudate, no erythema and lips, buccal mucosa, and tongue normal  NECK: supple, thyroid normal size, non-tender, without nodularity LYMPH:  no palpable lymphadenopathy in the cervical, axillary or inguinal LUNGS: clear to auscultation and percussion with normal breathing effort HEART: regular rate & rhythm and no murmurs and no lower extremity edema ABDOMEN:abdomen soft, non-tender and normal bowel sounds Musculoskeletal:no cyanosis of digits and no clubbing  PSYCH: alert & oriented x 3 with fluent speech NEURO: no focal motor/sensory deficits  LABORATORY DATA:  I have reviewed the data as listed CBC Latest Ref Rng & Units 11/25/2010  WBC 4.0 - 10.5 K/uL 7.4  Hemoglobin 12.0 - 15.0 g/dL 13.4  Hematocrit 36.0 - 46.0 % 41.9   Platelets 150 - 400 K/uL 246    '@cmpl'$ @  RADIOGRAPHIC STUDIES: I have personally reviewed the radiological images as listed and agreed with the findings in the report. No results found.   1. BRCA2 mutation carrier  -We discussed extensively about the risks of breast cancer (45-60% by age of 65) and ovarian cancer (11-18% by age of 66), and the risks of other malignancies, including pancreatic cancer, colorectal cancer, endometrial cancer, skin cancer, etc.  -For breast cancer, I recommend screening with annual mammogram and breast MRI, 6 months apart.  I also encouraged her to do self breast exam, and a clinical breast exam by a physician twice a year. -We discussed healthy lifestyle  to reduce her risk of breast cancer, which includes but not limited to, healthy diet, with more fresh fruit, vegetables, less red meat, more lean meat, regular exercise 4-5 times a week, such as brisk walking 30 to 40 minutes each time.  We also discussed being positive, and encourage her to do meditation etc.  -I discussed the role of prophylactic double mastectomy, with or without breast reconstruction.  She is interested, will refer her to Dr. Dwain Sarna, who did her mother's breast cancer surgery. -We also discussed chemoprevention with use of tamoxifen 20 mg daily for 5 years to help prevent breast cancer risk if she decides not bilateral mastectomy.  The side effects of tamoxifen, which includes but done not limited to, hot flashes, vaginal dryness, small risk of thrombosis, small risk of endometrial cancer (does not apply to her due to hysterectomy), were discussed with her in great details. Written material off tamoxifen was given to her today. She understands the risks and benefits of tamoxifen. She will think about it and let me know  -For ovarian cancer risk, I encouraged her to consider BSO, which is laparoscopic small surgery.  She has decided to have BSO and hysterectomy. --She has no family history of  pancreatic cancer, does not meet criteria for imaging screening, such as abdominal MRI or EUS.  We discussed pancreatic cancer related clinical symptoms, and I think it is reasonable to check tumor marker CA 19.9 every 6 to 12 months with her primary care physician.   Plan: -Surgical referral to Dr. Dwain Sarna discuss prophylactic mastectomy -She will call me if she is interested in tamoxifen if she does not do mastectomy -Continue annual mammogram and MRI screening and to mastectomy, I will order her first MRI of breast to be done in the next months -will keep her f/u open for now, I will be happy to see her back in a year if she prefers   Orders Placed This Encounter  Procedures   MR BREAST BILATERAL W WO CONTRAST INC CAD    Stat bcbs  Epic order  PF:06/26/20 [facility]  NO CYCLE: DX: BRCA GENE WT306: HT:5'4 NO COVID// NO NEEDS/ NO CLAUS// NO METAL REMOVED/ NO IMPLANTS/ NO BULLETS OR BB'S/ NO PACEMAKER PORT GLUCOSE MONITOR, SPINAL STIMULATOR OR INJECTORS/ NO BRAIN HEART EYE OR EAR SX//NO Hx of BRCA, YES FHX// NO Breast SX//NKDA TO IV DYE CONTRAST// AC SPW PT///INFORMED OF CANCELLATION POLICY     Standing Status:   Future    Standing Expiration Date:   10/25/2021    Order Specific Question:   If indicated for the ordered procedure, I authorize the administration of contrast media per Radiology protocol    Answer:   Yes    Order Specific Question:   What is the patient's sedation requirement?    Answer:   No Sedation    Order Specific Question:   Does the patient have a pacemaker or implanted devices?    Answer:   No    Order Specific Question:   Radiology Contrast Protocol - do NOT remove file path    Answer:   [email or web address]    Order Specific Question:   Preferred imaging location?    Answer:   [facility] (table limit-550lbs)   Ambulatory referral to General Surgery    Referral Priority:   Routine    Referral Type:   Surgical    Referral  Reason:   Specialty Services Required    Requested Specialty:   General Surgery  Number of Visits Requested:   1     All questions were answered. The patient knows to call the clinic with any problems, questions or concerns. I spent 40 minutes counseling the patient face to face. The total time spent in the appointment was 50 minutes and more than 50% was on counseling.     Truitt Merle, MD 10/25/2020 5:04 PM

## 2020-11-04 ENCOUNTER — Other Ambulatory Visit: Payer: Self-pay

## 2020-11-04 ENCOUNTER — Ambulatory Visit
Admission: RE | Admit: 2020-11-04 | Discharge: 2020-11-04 | Disposition: A | Discharge status: Home / Self Care | Payer: BC Managed Care – PPO | Source: Ambulatory Visit | Attending: Hematology | Admitting: Hematology

## 2020-11-04 DIAGNOSIS — Z1501 Genetic susceptibility to malignant neoplasm of breast: Secondary | ICD-10-CM

## 2020-11-04 DIAGNOSIS — Z1509 Genetic susceptibility to other malignant neoplasm: Secondary | ICD-10-CM

## 2020-11-04 MED ORDER — GADOBUTROL 1 MMOL/ML IV SOLN
10.0000 mL | Freq: Once | INTRAVENOUS | Status: AC | PRN
  Administered 2020-11-04: 10 mL via INTRAVENOUS

## 2021-02-05 NOTE — Progress Notes (Signed)
Surgical Instructions    Your procedure is scheduled on Wednesday, November 9th, 2022.   Report to [facility] Main Entrance "A" at 10:20 A.M., then check in with the Admitting office.  Call this number if you have problems the morning of surgery:  [phone]   If you have any questions prior to your surgery date call [phone]: Open Monday-Friday 8am-4pm    Remember:  Do not eat after midnight the night before your surgery  You may drink clear liquids until 09:20 the morning of your surgery.   Clear liquids allowed are: Water, Non-Citrus Juices (without pulp), Carbonated Beverages, Clear Tea, Black Coffee ONLY (NO MILK, CREAM OR POWDERED CREAMER of any kind), and Gatorade    Take these medicines the morning of surgery with A SIP OF WATER:  acetaminophen (TYLENOL) - if needed  As of today, STOP taking any Aspirin (unless otherwise instructed by your surgeon) Aleve, Naproxen, Ibuprofen, Motrin, Advil, Goody's, BC's, all herbal medications, fish oil, and all vitamins.    After your COVID test   You are not required to quarantine however you are required to wear a well-fitting mask when you are out and around people not in your household.  If your mask becomes wet or soiled, replace with a new one.  Wash your hands often with soap and water for 20 seconds or clean your hands with an alcohol-based hand sanitizer that contains at least 60% alcohol.  Do not share personal items.  Notify your provider: if you are in close contact with someone who has COVID  or if you develop a fever of 100.4 or greater, sneezing, cough, sore throat, shortness of breath or body aches.    The day of surgery:          Do not wear jewelry or makeup Do not wear lotions, powders, perfumes, or deodorant. Do not shave 48 hours prior to surgery.   Do not bring valuables to the hospital. DO Not wear nail polish, gel polish, artificial nails, or any other type of covering on natural nails including  finger and toenails. If patients have artificial nails, gel coating, etc. that need to be removed by a nail salon, please have this removed prior to surgery or surgery may need to be canceled/delayed if the surgeon/ anesthesia feels like the patient is unable to be adequately monitored.              [facility] is not responsible for any belongings or valuables.  Do NOT Smoke (Tobacco/Vaping)  24 hours prior to your procedure  If you use a CPAP at night, you may bring your mask for your overnight stay.   Contacts, glasses, hearing aids, dentures or partials may not be worn into surgery, please bring cases for these belongings   For patients admitted to the hospital, discharge time will be determined by your treatment team.   Patients discharged the day of surgery will not be allowed to drive home, and someone needs to stay with them for 24 hours.  NO VISITORS WILL BE ALLOWED IN PRE-OP WHERE PATIENTS ARE PREPPED FOR SURGERY.  ONLY 1 SUPPORT PERSON MAY BE PRESENT IN THE WAITING ROOM WHILE YOU ARE IN SURGERY.  IF YOU ARE TO BE ADMITTED, ONCE YOU ARE IN YOUR ROOM YOU WILL BE ALLOWED TWO (2) VISITORS. 1 (ONE) VISITOR MAY STAY OVERNIGHT BUT MUST ARRIVE TO THE ROOM BY 8pm.  Minor children may have two parents present. Special consideration for safety and communication needs will be  reviewed on a case by case basis.  Special instructions:    Oral Hygiene is also important to reduce your risk of infection.  Remember - BRUSH YOUR TEETH THE MORNING OF SURGERY WITH YOUR REGULAR TOOTHPASTE   [place]- Preparing For Surgery  Before surgery, you can play an important role. Because skin is not sterile, your skin needs to be as free of germs as possible. You can reduce the number of germs on your skin by washing with CHG (chlorahexidine gluconate) Soap before surgery.  CHG is an antiseptic cleaner which kills germs and bonds with the skin to continue killing germs even after washing.     Please do not  use if you have an allergy to CHG or antibacterial soaps. If your skin becomes reddened/irritated stop using the CHG.  Do not shave (including legs and underarms) for at least 48 hours prior to first CHG shower. It is OK to shave your face.  Please follow these instructions carefully.     Shower the NIGHT BEFORE SURGERY and the MORNING OF SURGERY with CHG Soap.   If you chose to wash your hair, wash your hair first as usual with your normal shampoo. After you shampoo, rinse your hair and body thoroughly to remove the shampoo.  Then [employer] and genitals (private parts) with your normal soap and rinse thoroughly to remove soap.  After that Use CHG Soap as you would any other liquid soap. You can apply CHG directly to the skin and wash gently with a scrungie or a clean washcloth.   Apply the CHG Soap to your body ONLY FROM THE NECK DOWN.  Do not use on open wounds or open sores. Avoid contact with your eyes, ears, mouth and genitals (private parts). Wash Face and genitals (private parts)  with your normal soap.   Wash thoroughly, paying special attention to the area where your surgery will be performed.  Thoroughly rinse your body with warm water from the neck down.  DO NOT shower/wash with your normal soap after using and rinsing off the CHG Soap.  Pat yourself dry with a CLEAN TOWEL.  Wear CLEAN PAJAMAS to bed the night before surgery  Place CLEAN SHEETS on your bed the night before your surgery  DO NOT SLEEP WITH PETS.   Day of Surgery:  Take a shower with CHG soap. Wear Clean/Comfortable clothing the morning of surgery Do not apply any deodorants/lotions.   Remember to brush your teeth WITH YOUR REGULAR TOOTHPASTE.   Please read over the following fact sheets that you were given.

## 2021-02-06 ENCOUNTER — Encounter (HOSPITAL_COMMUNITY)
Admission: RE | Admit: 2021-02-06 | Discharge: 2021-02-06 | Disposition: A | Discharge status: Home / Self Care | Payer: BC Managed Care – PPO | Source: Ambulatory Visit | Attending: Obstetrics and Gynecology | Admitting: Obstetrics and Gynecology

## 2021-02-06 ENCOUNTER — Other Ambulatory Visit: Payer: Self-pay

## 2021-02-06 ENCOUNTER — Encounter (HOSPITAL_COMMUNITY): Payer: Self-pay

## 2021-02-06 VITALS — BP 134/85 | HR 76 | Temp 98.4°F | Resp 18 | Ht 64.0 in | Wt 291.5 lb

## 2021-02-06 DIAGNOSIS — Z01812 Encounter for preprocedural laboratory examination: Secondary | ICD-10-CM | POA: Diagnosis present

## 2021-02-06 DIAGNOSIS — Z01818 Encounter for other preprocedural examination: Secondary | ICD-10-CM

## 2021-02-06 HISTORY — DX: Anxiety disorder, unspecified: F41.9

## 2021-02-06 HISTORY — DX: Depression, unspecified: F32.A

## 2021-02-06 LAB — TYPE AND SCREEN
ABO/RH(D): O POS
Antibody Screen: NEGATIVE

## 2021-02-06 LAB — CBC
HCT: 43.6 % (ref 36.0–46.0)
Hemoglobin: 13.4 g/dL (ref 12.0–15.0)
MCH: 25.3 pg — ABNORMAL LOW (ref 26.0–34.0)
MCHC: 30.7 g/dL (ref 30.0–36.0)
MCV: 82.4 fL (ref 80.0–100.0)
Platelets: 250 10*3/uL (ref 150–400)
RBC: 5.29 MIL/uL — ABNORMAL HIGH (ref 3.87–5.11)
RDW: 13.8 % (ref 11.5–15.5)
WBC: 7 10*3/uL (ref 4.0–10.5)
nRBC: 0 % (ref 0.0–0.2)

## 2021-02-06 NOTE — Progress Notes (Signed)
PCP - Dr. Marisue Humble Cardiologist - denies  PPM/ICD - n/a  Chest x-ray - n/a EKG - n/a Stress Test - denies ECHO - denies Cardiac Cath - denies  Sleep Study - denies CPAP - n/a  Fasting Blood Sugar - n/a Checks Blood Sugar _____ times a day- n/a  Blood Thinner Instructions: n/a Aspirin Instructions: n/a  ERAS Protcol - Yes PRE-SURGERY Ensure or G2- No  COVID TEST- Scheduled for 02/12/21. Patient is aware of date, time, and location.    Anesthesia review: No  Patient denies shortness of breath, fever, cough and chest pain at PAT appointment Patient denies history of cardiac disease.   All instructions explained to the patient, with a verbal understanding of the material. Patient agrees to go over the instructions while at home for a better understanding. Patient also instructed to self quarantine after being tested for COVID-19. The opportunity to ask questions was provided.

## 2021-02-12 ENCOUNTER — Other Ambulatory Visit (HOSPITAL_COMMUNITY)
Admission: RE | Admit: 2021-02-12 | Discharge: 2021-02-12 | Disposition: A | Discharge status: Home / Self Care | Payer: BC Managed Care – PPO | Source: Ambulatory Visit | Attending: Obstetrics and Gynecology | Admitting: Obstetrics and Gynecology

## 2021-02-12 ENCOUNTER — Other Ambulatory Visit: Payer: Self-pay | Admitting: Obstetrics and Gynecology

## 2021-02-12 DIAGNOSIS — N939 Abnormal uterine and vaginal bleeding, unspecified: Secondary | ICD-10-CM

## 2021-02-12 DIAGNOSIS — Z01818 Encounter for other preprocedural examination: Secondary | ICD-10-CM

## 2021-02-12 DIAGNOSIS — Z20822 Contact with and (suspected) exposure to covid-19: Secondary | ICD-10-CM | POA: Diagnosis not present

## 2021-02-12 DIAGNOSIS — Z01812 Encounter for preprocedural laboratory examination: Secondary | ICD-10-CM | POA: Insufficient documentation

## 2021-02-12 LAB — SARS CORONAVIRUS 2 (TAT 6-24 HRS): SARS Coronavirus 2: NEGATIVE

## 2021-02-12 NOTE — H&P (Deleted)
  The note originally documented on this encounter has been moved the the encounter in which it belongs.  

## 2021-02-12 NOTE — H&P (Signed)
Subjective: Chief Complaint(s):   Preop/ Abnormal Uterine Bleeding / BRCA 2 Gene mutation   HPI:  Isolation Precautions Has patient received COVID-19 vaccination? Yes- Moderna. Does patient report new onset of COVID symptoms? No. Has patient or close contact tested positive for COVID-19? No , not in the past 2 weeks.  General 60 yo presents for a pre-op visit for hysteroscopy D/C followed by robotic-assisted laparoscopic hysterectomy with bilateral saplingo-oophorectomy if pathology from [facility] is benign.  .  [place] 07/31/2020: Revealed uterus measuring 12.5 x7.2 x 8.2 cm. Endometrium thickened at 1.01 cm. 5 fibroids measured w/ the largest measuring 6.0 cm. All fibroids stable in size from prev U/S in Mar 2020. RT OV WNL. LT OV not visualized.  Pt was last seen on 10/04/2020 at this time discussed hysteroscopy D/C to evaluate for cancer. Pt advised that if she presents with cancer, surgical treatment options of total hysterectomy with bilateral oophorectomy or bilateral oophorectomy without total hysterectomy are possible. Discussed following up with an oncologist if cancer is found. Pt would like to move forward with total hysterectomy with bilateral oophorectomy if cancer is found. Discussed weight loss prior to surgery. Pt reports mild VB when she exercises at baseline. Discussed risks of surgery.  TODAY: Discussed with pt the plan for care. Discussed performing hysteroscopy D/C to evaluate for hyperplasia and malignancy. Pt advised that if she presents with cancer, surgical treatment options of total hysterectomy with bilateral oophorectomy or bilateral oophorectomy without total hysterectomy are possible. Discussed following up with an oncologist if cancer is found. Pt would like to move forward with total hysterectomy with bilateral oophorectomy if cancer is found.  Pt advised she will stay overnight, or 2 days if conversion to larger incision. She is advised that in order to be discharged from hospital,  she will need to be able to ambulate, urinate, flatulate, tolerate food by mouth, and take pain medication by mouth. Discussed risks of hysterectomy including but not limited to infection, bleeding, conversion to larger incision, damage to her bowel, bladder, or ureters, with the need for further surgery. Discussed risk of blood transfusion and risk of HIV or hep B&C (1 out of 2 million and 1 out of 200,000, respectively) with blood transfusion. Pt is aware of risks and desires blood transfusion if needed. Pt advised to avoid NSAIDs (Aspirin, Aleve, Advil, Ibuprofen, Motrin) from now until surgery given risk of bleeding during surgery. She may take Tylenol for pain management. She is advised to avoid eating or drinking starting midnight prior to surgery. Discussed post-surgery avoidance of driving for 1 week and avoidance of lifting weight greater than 10 lbs or intercourse for 6-8 weeks after procedure. Follow up in 4 weeks for 2 wk post-op visit. Current Medication: Taking  Joint Health(Glucosamine-MSM-Hyaluronic Acd).     Tylenol(APAP) 325 MG Tablet 1 tablet as needed Orally every 4 hrs.     Aleve(Naproxen) 220 MG Tablet 1 tablet with food or milk as needed Orally.     Biotin 5000 MCG Tablet 1 tablet Orally Once a day.     Magnesium Citrate - Powder as directed.     Multivitamin . Tablet 1 tablet by mouth Once daily.     Vitamin D 5000 Tablet 1 tablet Orally every other day.     Vitamin C ? Tablet 1 tablet Orally Once a day.     Ibuprofen 200 MG Tablet 1 tablet as needed Orally every 6 hrs.   Discontinued  Voltaren(Diclofenac Sodium (Ophth)) 1 % Gel as directed  Externally three times a week.     Mucinex(guaiFENesin CR) 600 MG Tablet Extended Release 12 Hour 1 tablet as needed Orally every 12 hrs.     Medication List reviewed and reconciled with the patient.  Medical History:  Obesity     Uterine fibroids     Hypertension (06/2019)     Prediabetes     BRCA-S gene       Allergies/Intolerance: Shellfish-derived Products: Allergy - unknown Melatonin: Side Effects - Bad dreams Gyn History:  Sexual activity not currently sexually active. Periods : spotting occassionally. LMP 2010. Birth control none. Last pap smear date 05/21/2018 - neg. Last mammogram date 06/26/2020 - normal. Abnormal pap smear hx of abnormal pap. STD Herpes.   OB History:  Number of pregnancies 1. Pregnancy # 1 D+C.   Surgical History:  cholecystectomy     Colonoscopy 10/2013     dilation and curettage 1990   Hospitalization:  cholecystectomy   Family History:  Father: deceased, Lung cancer, diagnosed with Lung Cancer, Cirrhosis    Mother: alive, Diabetes, osteoporosis, diagnosed with Diabetes, Breast cancer    Brother 1: alive, bi-polar,alcohol abuse    Brother2: alive, healthy    Sister 68: alive    Sister 2: alive, seizure disorder, diagnosed with Diabetes, Hypertension    Maternal Grand Mother: diagnosed with Breast cancer   Denies family hx colon cancer, colon polyps or liver disease; mother survivor of breast cancer (dx age 72, BRCA gene +).  Social History: General Tobacco use cigarettes: Never smoked, Tobacco history last updated 01/29/2021, Vaping No.  no EXPOSURE TO PASSIVE SMOKE.  no Alcohol.  DIET: working with a Engineer, maintenance (IT).  Marital Status: single.  Children: none.  OCCUPATION: employed, [employer] doctor.  ROS: CONSTITUTIONAL No" label="Chills" value="" options="no,yes" propid="91" itemid="193425" categoryid="10464" encounterid="[id number]"Chills No. No" label="Fatigue" value="" options="no,yes" propid="91" itemid="[id number]" categoryid="10464" encounterid="[id number]"Fatigue No. No" label="Fever" value="" options="no,yes" propid="91" itemid="10467" categoryid="10464" encounterid="[id number]"Fever No. No" label="Night sweats" value="" options="no,yes" propid="91" itemid="193426" categoryid="10464" encounterid="[id number]"Night sweats No. No" label="Recent  travel outside [place]" value="" options="no,yes" propid="91" itemid="444261" categoryid="10464" encounterid="[id number]"Recent travel outside [place] No. No" label="Sweats" value="" options="no,yes" propid="91" itemid="193427" categoryid="10464" encounterid="[id number]"Sweats No. No" label="Weight change" value="" options="no,yes" propid="91" itemid="194825" categoryid="10464" encounterid="[id number]"Weight change No.  OPHTHALMOLOGY no" label="Blurring of vision" value="" options="no,yes" propid="91" itemid="12520" categoryid="12516" encounterid="[id number]"Blurring of vision no. no" label="Change in vision" value="" options="no,yes" propid="91" itemid="193469" categoryid="12516" encounterid="[id number]"Change in vision no. no" label="Double vision" value="" options="no,yes" propid="91" itemid="194379" categoryid="12516" encounterid="[id number]"Double vision no.  ENT no" label="Dizziness" value="" options="no,yes" propid="91" itemid="193612" categoryid="10481" encounterid="[id number]"Dizziness no. Nose bleeds no. Sore throat no. Teeth pain no.  ALLERGY no" label="Hives" value="" options="no,yes" propid="91" itemid="202589" categoryid="138152" encounterid="[id number]"Hives no.  CARDIOLOGY no" label="Chest pain" value="" options="no,yes" propid="91" itemid="193603" categoryid="10488" encounterid="[id number]"Chest pain no. no" label="High blood pressure" value="" options="no,yes" propid="91" itemid="199089" categoryid="10488" encounterid="[id number]"High blood pressure no. no" label="Irregular heart beat" value="" options="no,yes" propid="91" itemid="202598" categoryid="10488" encounterid="[id number]"Irregular heart beat no. no" label="Leg edema" value="" options="no,yes" propid="91" itemid="10491" categoryid="10488" encounterid="[id number]"Leg edema no. no" label="Palpitations" value="" options="no,yes" propid="91" itemid="10490" categoryid="10488" encounterid="[id number]"Palpitations no.  RESPIRATORY no" label="Shortness of breath" value=""  options="no" propid="91" itemid="270013" categoryid="138132" encounterid="[id number]"Shortness of breath no. no" label="Cough" value="" options="no,yes" propid="91" itemid="172745" categoryid="138132" encounterid="[id number]"Cough no. no" label="Wheezing" value="" options="no,yes" propid="91" itemid="193621" categoryid="138132" encounterid="[id number]"Wheezing no.  UROLOGY no" label="Pain with urination" value="" options="no,yes" propid="91" itemid="194377" categoryid="138166" encounterid="[id number]"Pain with urination no. no" label="Urinary urgency" value="" options="no,yes" propid="91" itemid="193493" categoryid="138166" encounterid="[id number]"Urinary urgency no. no" label="Urinary frequency" value="" options="no,yes" propid="91" itemid="193492" categoryid="138166" encounterid="[id number]"Urinary frequency no. no" label="Urinary incontinence" value="" options="no,yes" propid="91" itemid="138171" categoryid="138166" encounterid="[id number]"Urinary incontinence no. No" label="Difficulty urinating" value="" options="no,yes" propid="91" itemid="138167" categoryid="138166" encounterid="[id number]"Difficulty urinating No. No" label="Blood in  urine" value="" options="no,yes" propid="91" itemid="138168" categoryid="138166" encounterid="[id number]"Blood in urine No.  GASTROENTEROLOGY no" label="Abdominal pain" value="" options="no,yes" propid="91" itemid="10496" categoryid="10494" encounterid="[id number]"Abdominal pain no. no" label="Appetite change" value="" options="no,yes" propid="91" itemid="193447" categoryid="10494" encounterid="[id number]"Appetite change no. no" label="Bloating/belching" value="" options="no,yes" propid="91" itemid="193448" categoryid="10494" encounterid="[id number]"Bloating/belching no. no" label="Blood in stool or on toilet paper" value="" options="no,yes" propid="91" itemid="10503" categoryid="10494" encounterid="[id number]"Blood in stool or on toilet paper no. no" label="Change in bowel movements" value=""  options="no,yes" propid="91" itemid="199106" categoryid="10494" encounterid="[id number]"Change in bowel movements no. no" label="Constipation" value="" options="no,yes" propid="91" itemid="10501" categoryid="10494" encounterid="[id number]"Constipation no. no" label="Diarrhea" value="" options="no,yes" propid="91" itemid="10502" categoryid="10494" encounterid="[id number]"Diarrhea no. no" label="Difficulty swallowing" value="" options="no,yes" propid="91" itemid="199104" categoryid="10494" encounterid="[id number]"Difficulty swallowing no. no" label="Nausea" value="" options="no,yes" propid="91" itemid="10499" categoryid="10494" encounterid="[id number]"Nausea no.  FEMALE REPRODUCTIVE no" label="Vulvar pain" value="" options="no,yes" propid="91" itemid="453725" categoryid="10525" encounterid="[id number]"Vulvar pain no. no" label="Vulvar rash" value="" options="no,yes" propid="91" itemid="453726" categoryid="10525" encounterid="[id number]"Vulvar rash no. no" label="Abnormal vaginal bleeding" value="" options="no, yes" propid="91" itemid="444315" categoryid="10525" encounterid="[id number]"Abnormal vaginal bleeding no. no" label="Breast pain" value="" options="no,yes" propid="91" itemid="186083" categoryid="10525" encounterid="[id number]"Breast pain no. no" label="Nipple discharge" value="" options="no,yes" propid="91" itemid="186084" categoryid="10525" encounterid="[id number]"Nipple discharge no. no" label="Pain with intercourse" value="" options="no,yes" propid="91" itemid="275823" categoryid="10525" encounterid="[id number]"Pain with intercourse no. no" label="Pelvic pain" value="" options="no,yes" propid="91" itemid="186082" categoryid="10525" encounterid="[id number]"Pelvic pain no. no" label="Unusual vaginal discharge" value="" options="no,yes" propid="91" itemid="278230" categoryid="10525" encounterid="[id number]"Unusual vaginal discharge no. no" label="Vaginal itching" value="" options="no,yes" propid="91" itemid="278942" categoryid="10525"  encounterid="[id number]"Vaginal itching no.  MUSCULOSKELETAL no" label="Muscle aches" value="" options="no,yes" propid="91" itemid="193461" categoryid="10514" encounterid="[id number]"Muscle aches no.  NEUROLOGY no" label="Headache" value="" options="no,yes" propid="91" itemid="12513" categoryid="12512" encounterid="[id number]"Headache no. no" label="Tingling/numbness" value="" options="no,yes" propid="91" itemid="12514" categoryid="12512" encounterid="[id number]"Tingling/numbness no. no" label="Weakness" value="" options="no,yes" propid="91" itemid="193468" categoryid="12512" encounterid="[id number]"Weakness no.  PSYCHOLOGY no" label="Depression" value="" options="" propid="91" itemid="275919" categoryid="10520" encounterid="[id number]"Depression no. no" label="Anxiety" value="" options="no,yes" propid="91" itemid="172748" categoryid="10520" encounterid="[id number]"Anxiety no. no" label="Nervousness" value="" options="no,yes" propid="91" itemid="199158" categoryid="10520" encounterid="[id number]"Nervousness no. no" label="Sleep disturbances" value="" options="no,yes" propid="91" itemid="12502" categoryid="10520" encounterid="[id number]"Sleep disturbances no. no " label="Suicidal ideation" value="" options="no,yes" propid="91" itemid="72718" categoryid="10520" encounterid="[id number]"Suicidal ideation no .  ENDOCRINOLOGY no" label="Excessive thirst" value="" options="no,yes" propid="91" itemid="194628" categoryid="12508" encounterid="[id number]"Excessive thirst no. no" label="Excessive urination" value="" options="no,yes" propid="91" itemid="196285" categoryid="12508" encounterid="[id number]"Excessive urination no. no" label="Hair loss" value="" options="no, yes" propid="91" itemid="444314" categoryid="12508" encounterid="[id number]"Hair loss no. no" label="Heat or cold intolerance" value="" options="" propid="91" itemid="447284" categoryid="12508" encounterid="[id number]"Heat or cold intolerance no.  HEMATOLOGY/LYMPH no"  label="Abnormal bleeding" value="" options="no,yes" propid="91" itemid="199152" categoryid="138157" encounterid="[id number]"Abnormal bleeding no. no" label="Easy bruising" value="" options="no,yes" propid="91" itemid="170653" categoryid="138157" encounterid="[id number]"Easy bruising no. no" label="Swollen glands" value="" options="no,yes" propid="91" itemid="138158" categoryid="138157" encounterid="[id number]"Swollen glands no.  DERMATOLOGY no" label="New/changing skin lesion" value="" options="no,yes" propid="91" itemid="199126" categoryid="12503" encounterid="[id number]"New/changing skin lesion no. no" label="Rash" value="" options="no,yes" propid="91" itemid="12504" categoryid="12503" encounterid="[id number]"Rash no. no" label="Sores" value="" options="" propid="91" itemid="444313" categoryid="12503" encounterid="[id number]"Sores no.  Negative except as stated in HPI.  Objective: Vitals: Wt 291.4, Wt change -14.6 lbs, Ht 65, BMI 48.49, Pulse sitting 80, BP sitting 135/87.  Past Results: Examination:  General Examination alert, oriented, NAD" label="CONSTITUTIONAL:" categoryPropId="10089" examid="193638"CONSTITUTIONAL: alert, oriented, NAD.  moist, warm" label="SKIN:" categoryPropId="10109" examid="193638"SKIN: moist, warm.  Conjunctiva clear" label="EYES:" categoryPropId="21468" examid="193638"EYES: Conjunctiva clear.  good I:E efffort noted, clear to auscultation bilaterally" label="LUNGS:" categoryPropId="87" examid="193638"LUNGS: good I:E efffort noted, clear to auscultation bilaterally.  HEART: regular sinus rhythm, regular rate and rhythm.  soft, non-tender/non-distended, bowel sounds present" label="ABDOMEN:" categoryPropId="88" examid="193638"ABDOMEN: soft, non-tender/non-distended, bowel sounds present.  normal external genitalia, labia - unremarkable, vagina - pink moist mucosa, no lesions or abnormal discharge, cervix - no discharge or lesions or CMT, adnexa - no masses or tenderness, uterus -  nontender and normal size on palpation" label="FEMALE GENITOURINARY:" categoryPropId="13414" examid="193638"FEMALE GENITOURINARY: normal external genitalia, labia - unremarkable, vagina - pink moist mucosa,  no lesions or abnormal discharge, cervix - no discharge or lesions or CMT, adnexa - no masses or tenderness, uterus - nontender and normal size on palpation.  affect normal, good eye contact" label="PSYCH:" categoryPropId="16316" examid="193638"PSYCH: affect normal, good eye contact.  Physical Examination: Chaperone present [facility] 01/29/2021 09:57:01 AM &gt; , for pelvic exam" label="Chaperone present" itemId="[id number]" categoryId="[id number]"Chaperone present [facility] 01/29/2021 09:57:01 AM > , for pelvic exam.    Assessment: Assessment:  Abnormal uterine bleeding (AUB) - N93.9 (Primary)     Thickened endometrium - R93.89     Fibroids - D21.9     BRCA2 gene mutation negative in female - Z13.71     Plan: Treatment: Abnormal uterine bleeding (AUB) Notes:  Pt advised she will stay overnight, or 2 days if conversion to larger incision. She is advised that in order to be discharged from hospital, she will need to be able to ambulate, urinate, flatulate, tolerate food by mouth, and take pain medication by mouth. Discussed risks of hysterectomy including but not limited to infection, bleeding, conversion to larger incision, damage to her bowel, bladder, or ureters, with the need for further surgery. Discussed risk of blood transfusion and risk of HIV or hep B&C (1 out of 2 million and 1 out of 200,000, respectively) with blood transfusion. Pt is aware of risks and desires blood transfusion if needed. Pt advised to avoid NSAIDs (Aspirin, Aleve, Advil, Ibuprofen, Motrin) from now until surgery given risk of bleeding during surgery. She may take Tylenol for pain management. She is advised to avoid eating or drinking starting midnight prior to surgery. Discussed post-surgery avoidance of driving  for 1 week and avoidance of lifting weight greater than 10 lbs or intercourse for 6-8 weeks after procedure. Follow up in 4 weeks for 2 wk post-op visit.  . Thickened endometrium Notes:  Pt advised she will stay overnight, or 2 days if conversion to larger incision. She is advised that in order to be discharged from hospital, she will need to be able to ambulate, urinate, flatulate, tolerate food by mouth, and take pain medication by mouth. Discussed risks of hysterectomy including but not limited to infection, bleeding, conversion to larger incision, damage to her bowel, bladder, or ureters, with the need for further surgery. Discussed risk of blood transfusion and risk of HIV or hep B&C (1 out of 2 million and 1 out of 200,000, respectively) with blood transfusion. Pt is aware of risks and desires blood transfusion if needed. Pt advised to avoid NSAIDs (Aspirin, Aleve, Advil, Ibuprofen, Motrin) from now until surgery given risk of bleeding during surgery. She may take Tylenol for pain management. She is advised to avoid eating or drinking starting midnight prior to surgery. Discussed post-surgery avoidance of driving for 1 week and avoidance of lifting weight greater than 10 lbs or intercourse for 6-8 weeks after procedure. Follow up in 4 weeks for 2 wk post-op visit.  . Fibroids Notes:  Pt advised she will stay overnight, or 2 days if conversion to larger incision. She is advised that in order to be discharged from hospital, she will need to be able to ambulate, urinate, flatulate, tolerate food by mouth, and take pain medication by mouth. Discussed risks of hysterectomy including but not limited to infection, bleeding, conversion to larger incision, damage to her bowel, bladder, or ureters, with the need for further surgery. Discussed risk of blood transfusion and risk of HIV or hep B&C (1 out of 2 million and 1 out of 200,000, respectively) with blood transfusion.  Pt is aware of risks and desires blood  transfusion if needed. Pt advised to avoid NSAIDs (Aspirin, Aleve, Advil, Ibuprofen, Motrin) from now until surgery given risk of bleeding during surgery. She may take Tylenol for pain management. She is advised to avoid eating or drinking starting midnight prior to surgery. Discussed post-surgery avoidance of driving for 1 week and avoidance of lifting weight greater than 10 lbs or intercourse for 6-8 weeks after procedure. Follow up in 4 weeks for 2 wk post-op visit.  Marland Kitchen

## 2021-02-13 ENCOUNTER — Other Ambulatory Visit: Payer: Self-pay

## 2021-02-13 ENCOUNTER — Encounter (HOSPITAL_COMMUNITY)
Admission: RE | Disposition: A | Discharge status: Home / Self Care | Payer: Self-pay | Source: Home / Self Care | Attending: Obstetrics and Gynecology

## 2021-02-13 ENCOUNTER — Observation Stay (HOSPITAL_COMMUNITY): Payer: BC Managed Care – PPO | Admitting: Anesthesiology

## 2021-02-13 ENCOUNTER — Observation Stay (HOSPITAL_COMMUNITY)
Admission: RE | Admit: 2021-02-13 | Discharge: 2021-02-14 | Disposition: A | Discharge status: Home / Self Care | Payer: BC Managed Care – PPO | Attending: Obstetrics and Gynecology | Admitting: Obstetrics and Gynecology

## 2021-02-13 ENCOUNTER — Encounter (HOSPITAL_COMMUNITY): Payer: Self-pay | Admitting: Obstetrics and Gynecology

## 2021-02-13 DIAGNOSIS — Z1501 Genetic susceptibility to malignant neoplasm of breast: Secondary | ICD-10-CM | POA: Insufficient documentation

## 2021-02-13 DIAGNOSIS — Z801 Family history of malignant neoplasm of trachea, bronchus and lung: Secondary | ICD-10-CM | POA: Insufficient documentation

## 2021-02-13 DIAGNOSIS — Z803 Family history of malignant neoplasm of breast: Secondary | ICD-10-CM | POA: Diagnosis not present

## 2021-02-13 DIAGNOSIS — I10 Essential (primary) hypertension: Secondary | ICD-10-CM | POA: Diagnosis not present

## 2021-02-13 DIAGNOSIS — E669 Obesity, unspecified: Secondary | ICD-10-CM | POA: Diagnosis not present

## 2021-02-13 DIAGNOSIS — R7303 Prediabetes: Secondary | ICD-10-CM | POA: Insufficient documentation

## 2021-02-13 DIAGNOSIS — Z6841 Body Mass Index (BMI) 40.0 and over, adult: Secondary | ICD-10-CM | POA: Diagnosis not present

## 2021-02-13 DIAGNOSIS — Z833 Family history of diabetes mellitus: Secondary | ICD-10-CM | POA: Insufficient documentation

## 2021-02-13 DIAGNOSIS — N939 Abnormal uterine and vaginal bleeding, unspecified: Secondary | ICD-10-CM | POA: Diagnosis present

## 2021-02-13 HISTORY — PX: ROBOTIC ASSISTED LAPAROSCOPIC HYSTERECTOMY AND SALPINGECTOMY: SHX6379

## 2021-02-13 HISTORY — PX: HYSTEROSCOPY WITH D & C: SHX1775

## 2021-02-13 LAB — CBC
HCT: 45.3 % (ref 36.0–46.0)
Hemoglobin: 14.4 g/dL (ref 12.0–15.0)
MCH: 26.5 pg (ref 26.0–34.0)
MCHC: 31.8 g/dL (ref 30.0–36.0)
MCV: 83.3 fL (ref 80.0–100.0)
Platelets: 233 10*3/uL (ref 150–400)
RBC: 5.44 MIL/uL — ABNORMAL HIGH (ref 3.87–5.11)
RDW: 14 % (ref 11.5–15.5)
WBC: 6.5 10*3/uL (ref 4.0–10.5)
nRBC: 0 % (ref 0.0–0.2)

## 2021-02-13 LAB — ABO/RH: ABO/RH(D): O POS

## 2021-02-13 LAB — GLUCOSE, CAPILLARY: Glucose-Capillary: 131 mg/dL — ABNORMAL HIGH (ref 70–99)

## 2021-02-13 LAB — POCT PREGNANCY, URINE: Preg Test, Ur: NEGATIVE

## 2021-02-13 SURGERY — DILATATION AND CURETTAGE /HYSTEROSCOPY
Anesthesia: General | Site: Uterus

## 2021-02-13 MED ORDER — HYDROMORPHONE HCL 1 MG/ML IJ SOLN
INTRAMUSCULAR | Status: AC
  Administered 2021-02-13: 0.5 mg via INTRAVENOUS
  Filled 2021-02-13: qty 1

## 2021-02-13 MED ORDER — SUGAMMADEX SODIUM 500 MG/5ML IV SOLN
INTRAVENOUS | Status: AC
  Filled 2021-02-13: qty 5

## 2021-02-13 MED ORDER — LACTATED RINGERS IV SOLN: INTRAVENOUS | Status: DC | PRN

## 2021-02-13 MED ORDER — SUGAMMADEX SODIUM 200 MG/2ML IV SOLN
INTRAVENOUS | Status: DC | PRN
  Administered 2021-02-13: 300 mg via INTRAVENOUS

## 2021-02-13 MED ORDER — ALUM & MAG HYDROXIDE-SIMETH 200-200-20 MG/5ML PO SUSP: 30.0000 mL | ORAL | Status: DC | PRN

## 2021-02-13 MED ORDER — LACTATED RINGERS IV SOLN: INTRAVENOUS | Status: DC

## 2021-02-13 MED ORDER — HYDROMORPHONE HCL 1 MG/ML IJ SOLN
0.2500 mg | INTRAMUSCULAR | Status: DC | PRN
Start: 2021-02-13 — End: 2021-02-13
  Administered 2021-02-13: 0.5 mg via INTRAVENOUS

## 2021-02-13 MED ORDER — DEXAMETHASONE SODIUM PHOSPHATE 10 MG/ML IJ SOLN
INTRAMUSCULAR | Status: AC
  Filled 2021-02-13: qty 1

## 2021-02-13 MED ORDER — SIMETHICONE 80 MG PO CHEW: 80.0000 mg | CHEWABLE_TABLET | Freq: Four times a day (QID) | ORAL | Status: DC | PRN

## 2021-02-13 MED ORDER — EPHEDRINE SULFATE-NACL 50-0.9 MG/10ML-% IV SOSY
PREFILLED_SYRINGE | INTRAVENOUS | Status: DC | PRN
  Administered 2021-02-13: 5 mg via INTRAVENOUS
  Administered 2021-02-13 (×2): 10 mg via INTRAVENOUS

## 2021-02-13 MED ORDER — ONDANSETRON HCL 4 MG/2ML IJ SOLN: 4.0000 mg | Freq: Four times a day (QID) | INTRAMUSCULAR | Status: DC | PRN

## 2021-02-13 MED ORDER — SODIUM CHLORIDE 0.9 % IV SOLN
2.0000 g | INTRAVENOUS | Status: AC
  Administered 2021-02-13: 2 g via INTRAVENOUS
  Filled 2021-02-13: qty 2

## 2021-02-13 MED ORDER — FENTANYL CITRATE (PF) 250 MCG/5ML IJ SOLN
INTRAMUSCULAR | Status: DC | PRN
  Administered 2021-02-13: 100 ug via INTRAVENOUS
  Administered 2021-02-13 (×3): 50 ug via INTRAVENOUS

## 2021-02-13 MED ORDER — LIDOCAINE 2% (20 MG/ML) 5 ML SYRINGE
INTRAMUSCULAR | Status: DC | PRN
  Administered 2021-02-13: 60 mg via INTRAVENOUS

## 2021-02-13 MED ORDER — HYDROMORPHONE HCL 1 MG/ML IJ SOLN
INTRAMUSCULAR | Status: DC | PRN
  Administered 2021-02-13: .5 mg via INTRAVENOUS

## 2021-02-13 MED ORDER — EPHEDRINE 5 MG/ML INJ
INTRAVENOUS | Status: AC
  Filled 2021-02-13: qty 5

## 2021-02-13 MED ORDER — POVIDONE-IODINE 10 % EX SWAB
2.0000 "application " | Freq: Once | CUTANEOUS | Status: AC
  Administered 2021-02-13: 2 via TOPICAL

## 2021-02-13 MED ORDER — ONDANSETRON HCL 4 MG/2ML IJ SOLN
INTRAMUSCULAR | Status: DC | PRN
  Administered 2021-02-13: 4 mg via INTRAVENOUS

## 2021-02-13 MED ORDER — CHLORHEXIDINE GLUCONATE 0.12 % MT SOLN
15.0000 mL | Freq: Once | OROMUCOSAL | Status: AC
  Administered 2021-02-13: 15 mL via OROMUCOSAL
  Filled 2021-02-13: qty 15

## 2021-02-13 MED ORDER — PHENYLEPHRINE HCL-NACL 20-0.9 MG/250ML-% IV SOLN
INTRAVENOUS | Status: DC | PRN
  Administered 2021-02-13: 50 ug/min via INTRAVENOUS

## 2021-02-13 MED ORDER — ROCURONIUM BROMIDE 10 MG/ML (PF) SYRINGE
PREFILLED_SYRINGE | INTRAVENOUS | Status: AC
  Filled 2021-02-13: qty 10

## 2021-02-13 MED ORDER — MENTHOL 3 MG MT LOZG: 1.0000 | LOZENGE | OROMUCOSAL | Status: DC | PRN

## 2021-02-13 MED ORDER — SODIUM CHLORIDE 0.9 % IV SOLN
INTRAVENOUS | Status: DC | PRN
  Administered 2021-02-13: 110 mL

## 2021-02-13 MED ORDER — DEXAMETHASONE SODIUM PHOSPHATE 10 MG/ML IJ SOLN
INTRAMUSCULAR | Status: DC | PRN
  Administered 2021-02-13: 10 mg via INTRAVENOUS

## 2021-02-13 MED ORDER — ORAL CARE MOUTH RINSE: 15.0000 mL | Freq: Once | OROMUCOSAL | Status: AC

## 2021-02-13 MED ORDER — FENTANYL CITRATE (PF) 250 MCG/5ML IJ SOLN
INTRAMUSCULAR | Status: AC
  Filled 2021-02-13: qty 5

## 2021-02-13 MED ORDER — HYDROMORPHONE HCL 1 MG/ML IJ SOLN
0.2000 mg | INTRAMUSCULAR | Status: DC | PRN
  Administered 2021-02-13: 0.6 mg via INTRAVENOUS
  Filled 2021-02-13: qty 1

## 2021-02-13 MED ORDER — HYDROMORPHONE HCL 1 MG/ML IJ SOLN
INTRAMUSCULAR | Status: AC
  Filled 2021-02-13: qty 0.5

## 2021-02-13 MED ORDER — OXYCODONE HCL 5 MG PO TABS
5.0000 mg | ORAL_TABLET | ORAL | Status: DC | PRN
  Administered 2021-02-13: 10 mg via ORAL
  Filled 2021-02-13: qty 2

## 2021-02-13 MED ORDER — MIDAZOLAM HCL 2 MG/2ML IJ SOLN
INTRAMUSCULAR | Status: AC
  Filled 2021-02-13: qty 2

## 2021-02-13 MED ORDER — ONDANSETRON HCL 4 MG PO TABS: 4.0000 mg | ORAL_TABLET | Freq: Four times a day (QID) | ORAL | Status: DC | PRN

## 2021-02-13 MED ORDER — ACETAMINOPHEN 500 MG PO TABS
1000.0000 mg | ORAL_TABLET | ORAL | Status: AC
  Administered 2021-02-13: 1000 mg via ORAL
  Filled 2021-02-13: qty 2

## 2021-02-13 MED ORDER — SODIUM CHLORIDE 0.9 % IR SOLN
Status: DC | PRN
  Administered 2021-02-13: 1000 mL

## 2021-02-13 MED ORDER — SENNA 8.6 MG PO TABS
1.0000 | ORAL_TABLET | Freq: Two times a day (BID) | ORAL | Status: DC
  Administered 2021-02-13 – 2021-02-14 (×2): 8.6 mg via ORAL
  Filled 2021-02-13 (×2): qty 1

## 2021-02-13 MED ORDER — MIDAZOLAM HCL 5 MG/5ML IJ SOLN
INTRAMUSCULAR | Status: DC | PRN
Start: 2021-02-13 — End: 2021-02-13
  Administered 2021-02-13: 2 mg via INTRAVENOUS

## 2021-02-13 MED ORDER — ZOLPIDEM TARTRATE 5 MG PO TABS: 5.0000 mg | ORAL_TABLET | Freq: Every evening | ORAL | Status: DC | PRN

## 2021-02-13 MED ORDER — ONDANSETRON HCL 4 MG/2ML IJ SOLN
INTRAMUSCULAR | Status: AC
  Filled 2021-02-13: qty 2

## 2021-02-13 MED ORDER — IBUPROFEN 600 MG PO TABS
600.0000 mg | ORAL_TABLET | Freq: Four times a day (QID) | ORAL | Status: DC
  Administered 2021-02-13 – 2021-02-14 (×4): 600 mg via ORAL
  Filled 2021-02-13 (×4): qty 1

## 2021-02-13 MED ORDER — ROCURONIUM BROMIDE 10 MG/ML (PF) SYRINGE
PREFILLED_SYRINGE | INTRAVENOUS | Status: DC | PRN
  Administered 2021-02-13 (×2): 20 mg via INTRAVENOUS
  Administered 2021-02-13: 80 mg via INTRAVENOUS
  Administered 2021-02-13: 20 mg via INTRAVENOUS

## 2021-02-13 MED ORDER — KETOROLAC TROMETHAMINE 30 MG/ML IJ SOLN
INTRAMUSCULAR | Status: DC | PRN
  Administered 2021-02-13: 30 mg via INTRAVENOUS

## 2021-02-13 MED ORDER — KETOROLAC TROMETHAMINE 30 MG/ML IJ SOLN
INTRAMUSCULAR | Status: AC
  Filled 2021-02-13: qty 1

## 2021-02-13 MED ORDER — PROPOFOL 10 MG/ML IV BOLUS
INTRAVENOUS | Status: DC | PRN
  Administered 2021-02-13: 150 mg via INTRAVENOUS

## 2021-02-13 MED ORDER — DEXMEDETOMIDINE (PRECEDEX) IN NS 20 MCG/5ML (4 MCG/ML) IV SYRINGE
PREFILLED_SYRINGE | INTRAVENOUS | Status: DC | PRN
  Administered 2021-02-13: 12 ug via INTRAVENOUS
  Administered 2021-02-13: 8 ug via INTRAVENOUS

## 2021-02-13 MED ORDER — ACETAMINOPHEN 500 MG PO TABS
1000.0000 mg | ORAL_TABLET | Freq: Four times a day (QID) | ORAL | Status: DC
  Administered 2021-02-13 – 2021-02-14 (×3): 1000 mg via ORAL
  Filled 2021-02-13 (×3): qty 2

## 2021-02-13 SURGICAL SUPPLY — 62 items
ADH SKN CLS LQ APL DERMABOND (GAUZE/BANDAGES/DRESSINGS) ×2
APL SRG 38 LTWT LNG FL B (MISCELLANEOUS) ×2
APPLICATOR ARISTA FLEXITIP XL (MISCELLANEOUS) ×1 IMPLANT
COVER BACK TABLE 60X90IN (DRAPES) ×3 IMPLANT
COVER TIP SHEARS 8 DVNC (MISCELLANEOUS) ×2 IMPLANT
COVER TIP SHEARS 8MM DA VINCI (MISCELLANEOUS) ×3
DEFOGGER SCOPE WARMER CLEARIFY (MISCELLANEOUS) ×3 IMPLANT
DERMABOND ADHESIVE PROPEN (GAUZE/BANDAGES/DRESSINGS) ×1
DERMABOND ADVANCED .7 DNX6 (GAUZE/BANDAGES/DRESSINGS) IMPLANT
DRAPE ARM DVNC X/XI (DISPOSABLE) ×8 IMPLANT
DRAPE COLUMN DVNC XI (DISPOSABLE) ×2 IMPLANT
DRAPE DA VINCI XI ARM (DISPOSABLE) ×12
DRAPE DA VINCI XI COLUMN (DISPOSABLE) ×3
DRAPE UTILITY 15X26 TOWEL STRL (DRAPES) ×3 IMPLANT
DURAPREP 26ML APPLICATOR (WOUND CARE) ×3 IMPLANT
ELECT REM PT RETURN 9FT ADLT (ELECTROSURGICAL) ×3
ELECTRODE REM PT RTRN 9FT ADLT (ELECTROSURGICAL) ×2 IMPLANT
GAUZE 4X4 16PLY ~~LOC~~+RFID DBL (SPONGE) ×2 IMPLANT
GLOVE SURG ENC TEXT LTX SZ6.5 (GLOVE) ×6 IMPLANT
GLOVE SURG ENC TEXT LTX SZ7 (GLOVE) ×9 IMPLANT
GLOVE SURG UNDER POLY LF SZ6.5 (GLOVE) ×3 IMPLANT
GLOVE SURG UNDER POLY LF SZ7 (GLOVE) ×18 IMPLANT
GOWN STRL REUS W/ TWL LRG LVL3 (GOWN DISPOSABLE) ×4 IMPLANT
GOWN STRL REUS W/TWL LRG LVL3 (GOWN DISPOSABLE) ×6
HEMOSTAT ARISTA ABSORB 3G PWDR (HEMOSTASIS) ×1 IMPLANT
HIBICLENS CHG 4% 4OZ BTL (MISCELLANEOUS) ×3 IMPLANT
IRRIGATION STRYKERFLOW (MISCELLANEOUS) ×2 IMPLANT
IRRIGATOR STRYKERFLOW (MISCELLANEOUS) ×3
KIT PROCEDURE FLUENT (KITS) ×3 IMPLANT
KIT TURNOVER KIT B (KITS) ×3 IMPLANT
LEGGING LITHOTOMY PAIR STRL (DRAPES) ×3 IMPLANT
OBTURATOR OPTICAL STANDARD 8MM (TROCAR) ×3
OBTURATOR OPTICAL STND 8 DVNC (TROCAR) ×2
OBTURATOR OPTICALSTD 8 DVNC (TROCAR) IMPLANT
OCCLUDER COLPOPNEUMO (BALLOONS) ×3 IMPLANT
PACK ROBOT WH (CUSTOM PROCEDURE TRAY) ×3 IMPLANT
PACK ROBOTIC GOWN (GOWN DISPOSABLE) ×3 IMPLANT
PACK TRENDGUARD 600 HYBRD PROC (MISCELLANEOUS) IMPLANT
PACK VAGINAL MINOR WOMEN LF (CUSTOM PROCEDURE TRAY) ×3 IMPLANT
PAD OB MATERNITY 4.3X12.25 (PERSONAL CARE ITEMS) ×3 IMPLANT
PROTECTOR NERVE ULNAR (MISCELLANEOUS) ×3 IMPLANT
SEAL CANN UNIV 5-8 DVNC XI (MISCELLANEOUS) ×8 IMPLANT
SEAL XI 5MM-8MM UNIVERSAL (MISCELLANEOUS) ×12
SEALER VESSEL DA VINCI XI (MISCELLANEOUS) ×3
SEALER VESSEL EXT DVNC XI (MISCELLANEOUS) IMPLANT
SET TRI-LUMEN FLTR TB AIRSEAL (TUBING) ×1 IMPLANT
SUT VIC AB 0 CT1 27 (SUTURE) ×12
SUT VIC AB 0 CT1 27XBRD ANBCTR (SUTURE) ×4 IMPLANT
SUT VIC AB 3-0 CT1 27 (SUTURE) ×3
SUT VIC AB 3-0 CT1 TAPERPNT 27 (SUTURE) IMPLANT
SUT VIC AB 3-0 SH 27 (SUTURE) ×3
SUT VIC AB 3-0 SH 27X BRD (SUTURE) IMPLANT
SUT VICRYL 0 UR6 27IN ABS (SUTURE) ×1 IMPLANT
SUT VICRYL RAPIDE 4/0 PS 2 (SUTURE) ×8 IMPLANT
SUT VLOC 180 0 9IN  GS21 (SUTURE) ×3
SUT VLOC 180 0 9IN GS21 (SUTURE) ×2 IMPLANT
TIP UTERINE 6.7X10CM GRN DISP (MISCELLANEOUS) ×1 IMPLANT
TOWEL GREEN STERILE FF (TOWEL DISPOSABLE) ×5 IMPLANT
TRENDGUARD 600 HYBRID PROC PK (MISCELLANEOUS) ×3
TROCAR PORT AIRSEAL 8X120 (TROCAR) ×3 IMPLANT
UNDERPAD 30X36 HEAVY ABSORB (UNDERPADS AND DIAPERS) ×3 IMPLANT
WATER STERILE IRR 1000ML POUR (IV SOLUTION) ×3 IMPLANT

## 2021-02-13 NOTE — Op Note (Signed)
02/13/2021  5:06 PM  PATIENT:  [name]  60 y.o. female  PRE-OPERATIVE DIAGNOSIS:  Abnormal Uterine Bleeding  Thickened Endometrium  POST-OPERATIVE DIAGNOSIS:  Abnormal Uterine Bleeding  Thickened Endometrium  PROCEDURE:  Procedure(s): DILATATION AND CURETTAGE /HYSTEROSCOPY WITH FROZEN PATHOLOGY. (N/A) XI ROBOTIC ASSISTED LAPAROSCOPIC HYSTERECTOMY AND BILATERALSALPINGO-OOPHORECTOMY. (Bilateral)  SURGEON:  Surgeon(s) and Role:    Christophe Louis, MD - Primary    * Drema Dallas, DO - Assisting  PHYSICIAN ASSISTANT: None  ASSISTANTS: Dr. Drema Dallas assisted due to complexity of the anatomy    ANESTHESIA:   general  EBL:  75 mL   BLOOD ADMINISTERED:none  DRAINS: Urinary Catheter (Foley)   LOCAL MEDICATIONS USED:  OTHER Ropivacaine   SPECIMEN:  Source of Specimen:  Uterus cervix and bilateral fallopian tubes and ovaries  .Marland Kitchen Endometrial curetting's    DISPOSITION OF SPECIMEN:  PATHOLOGY  COUNTS:  YES  TOURNIQUET:  * No tourniquets in log *  DICTATION: .Note written in [place]: Admit for overnight observation  PATIENT DISPOSITION:  PACU - hemodynamically stable.   Delay start of Pharmacological VTE agent (>24 hrs) due to surgical blood loss or risk of bleeding: not applicable  Findings: Normal external genitalia  and vaginal mucosa. Small 5 mm cervical polyp...  During hysteroscopy an endometrial polyp was noted. The endometrium otherwise appeared atrophic. .. during Robotic hysterectomy  a multiple fibroid uterus was noted. Normal appearing fallopian tube and ovaries.   Procedure: Patient was taken to the operating room where she was placed under general anesthesia. She was placed in the dorsal lithotomy position. She was prepped and draped in the usual sterile fashion. A speculum was placed into the vaginal vault. The anterior lip of the cervix was grasped with a single-tooth tenaculum.  Ropivacaine was injected at the 2 , 4  and 8:00  and 10  o'clock positions of the cervix. The cervix was then sounded to 12 cm. The cervix was dilated to approximately 6 mm. Diagnostic hysteroscope was inserted. The findings noted above. The hysteroscope was removed. Sharp curet was introduced and endometrial curetting's were obtained.  The curetting's were sent to pathology and frozen section revealed benign tissue.   We then proceeded with Robotic hysterectomy.    0 vicryl suture placed at the 12 and 6:00 positions Of the cervix to facilitate placement of a Ru mi uterine manipulator. The manipulator was placed without difficulty. Weighted speculum and Deaver were removed .  Attention was turned to the patient's abdomen where a 8 mm trocar was placed 2 cm above the umbilicus. under direct visualization . The pneumoperitoneum was achieved with PCO2 gas. The laparoscope was removed. 60 cc of ropivacaine were injected into the abdominal cavity. The laparoscope was reinserted. An 8 mm trocar was placed in the right upper quadrant 16 centimeters from the umbilicus.later connected to robotic arm #4). An 8 MM incision was made in the Right upper quadrant TROCAR WAS PLACED 8 cm from the umbilicus. Later connected to robotic arm #3. An 8 mm incision was made in the left upper quadrant 16 cm from the umbilicus and connected to robot arm #1. Marland Kitchen Attention was turned to the left upper quadrant where a 8 mm midclavicular assistant trocar was placed. ( All incision sites were injected with 10 cc of ropivacaine prior to port placement. )  Once all ports had been placed under direct visualization.The laparoscope was removed and the da Vinci robotic system was thin  left- side docked. The  robotic arms were connected to the corresponding trocars as listed above. The laparoscope was then reinserted. The long tip bipolar forceps were placed into port #1. The  pro grasp placed in the port #4. A vessel sealer was placed in port #3. All instruments were directed into the pelvis under  direct visualization.  Attention was turned to the surgeons console. The left ureter was identified.  The left infundibulopelvic ligament was cauterized and transected with the vessel sealer The broad ligament was cauterized and transected with the vessel sealer .The round ligament was cauterized and transected with the vessel sealer  The anterior leaf of broad ligament was incised along the bladder reflection to the midline.  The right ureter was identified. The right infundibulopelvic  ligament was cauterized and transected with the vessel sealer. The right broad ligament was cauterized and transected with the vessel sealer. The right round ligament was cauterized and transected with the vessel sealer The broad ligament was incised to the midline. The bladder was dissected off the lower uterine segments of the cervix via sharp and blunt dissection.   The uterine arteries were skeleton bilaterally. They were  cauterized and transected with the vessel sealer The KOH ring was identified. The anterior colpotomy was performed followed by the posterior colpotomy. Once the uterus,cervix and bilateral fallopian tubes were completely excised  an attempt was made to remove the specimen through the vagina. The vagina was too narrow for removal of the specimen. The specimen was bivalved using robotic hook and then successfully removed through the vagina.   The  bipolar forceps and scissors were removed and cobra forceps were placed in the port #1 and the cutting needle driver was placed in to port #3.  The vaginal cuff was closed with running suture if 0 v-lock. The pelvis was irrigated. Marland KitchenMarland KitchenMarland KitchenExcellent hemostasis was noted. Arista was placed along the vaginal cuff.  All pelvic pedicles were examined and hemostasis was noted.  All instruments removed from the ports. All ports were removed under direct Visualization. The pneumoperitoneum was released. The skin incisions were closed with 4-0 Vicryl and then covered with  Derma bond.   Attention was turned to the vagina. The patient was noted to have a right periurethral laceration that was repaired with 3-0 vicryl. Silver nitrate was also used to obtain hemostasis. The foley catheter was removed.     Sponge lap and needle counts were correct x 2.  The patient was awakened from anesthesia and taken to the recovery room in stable condition.

## 2021-02-13 NOTE — Anesthesia Preprocedure Evaluation (Addendum)
Anesthesia Evaluation  Patient identified by MRN, date of birth, ID band Patient awake    Reviewed: Allergy & Precautions, H&P , NPO status , Patient's Chart, lab work & pertinent test results  Airway Mallampati: II  TM Distance: >3 FB Neck ROM: Full    Dental no notable dental hx. (+) Teeth Intact, Dental Advisory Given   Pulmonary neg pulmonary ROS,    Pulmonary exam normal breath sounds clear to auscultation       Cardiovascular negative cardio ROS   Rhythm:Regular Rate:Normal     Neuro/Psych Anxiety Depression negative neurological ROS     GI/Hepatic negative GI ROS, Neg liver ROS,   Endo/Other  Morbid obesity  Renal/GU negative Renal ROS  negative genitourinary   Musculoskeletal   Abdominal   Peds  Hematology negative hematology ROS (+)   Anesthesia Other Findings   Reproductive/Obstetrics negative OB ROS                            Anesthesia Physical Anesthesia Plan  ASA: 3  Anesthesia Plan: General   Post-op Pain Management:    Induction: Intravenous  PONV Risk Score and Plan: 4 or greater and Ondansetron, Dexamethasone and Midazolam  Airway Management Planned: Oral ETT  Additional Equipment:   Intra-op Plan:   Post-operative Plan: Extubation in OR  Informed Consent: I have reviewed the patients History and Physical, chart, labs and discussed the procedure including the risks, benefits and alternatives for the proposed anesthesia with the patient or authorized representative who has indicated his/her understanding and acceptance.     Dental advisory given  Plan Discussed with: CRNA  Anesthesia Plan Comments:         Anesthesia Quick Evaluation

## 2021-02-13 NOTE — Anesthesia Procedure Notes (Signed)
Procedure Name: Intubation Date/Time: 02/13/2021 1:08 PM Performed by: Renato Shin, CRNA Pre-anesthesia Checklist: Patient identified, Emergency Drugs available, Suction available and Patient being monitored Patient Re-evaluated:Patient Re-evaluated prior to induction Oxygen Delivery Method: Circle system utilized Preoxygenation: Pre-oxygenation with 100% oxygen Induction Type: IV induction Ventilation: Mask ventilation without difficulty Laryngoscope Size: Miller and 2 Grade View: Grade I Tube type: Oral Tube size: 7.0 mm Number of attempts: 1 Airway Equipment and Method: Stylet and Oral airway Placement Confirmation: ETT inserted through vocal cords under direct vision, positive ETCO2 and breath sounds checked- equal and bilateral Secured at: 21 cm Tube secured with: Tape Dental Injury: Teeth and Oropharynx as per pre-operative assessment

## 2021-02-13 NOTE — Progress Notes (Signed)
PT arrived from PACU and stated she had the sensation to urinate. NT helped pt ambulate into the BR and called RN, when RN enetered the room pt stated that she only felt like she peed " 3 drops" and everytime she wiped it was blood and clots. RN asked pt to show her pt wipe again and was a moderate amount of blood on toilet paper along with medium sized blood clots. RN saw only blood in the toilet and called Dr. Landry Mellow. Dr. Landry Mellow notified and updated on the patietn and gave verbal order for foley and would come and assess the patient in the morning. Pt spoke with MD and notified of the plan. Tina,RN helped Vanessa Pincock,RN get the patient back into the bed, pt was nauseous and vomited before being able to get into the bed. Foley placed with the help of Tina,RN. Pt comfortable in bed.

## 2021-02-13 NOTE — Transfer of Care (Signed)
Immediate Anesthesia Transfer of Care Note  Patient: [name]  Procedure(s) Performed: DILATATION AND CURETTAGE /HYSTEROSCOPY WITH FROZEN PATHOLOGY. (Uterus) XI ROBOTIC ASSISTED LAPAROSCOPIC HYSTERECTOMY AND BILATERALSALPINGO-OOPHORECTOMY. (Bilateral: Abdomen)  Patient Location: PACU  Anesthesia Type:General  Level of Consciousness: awake and patient cooperative  Airway & Oxygen Therapy: Patient Spontanous Breathing and Patient connected to face mask oxygen  Post-op Assessment: Report given to RN and Post -op Vital signs reviewed and stable  Post vital signs: Reviewed and stable  Last Vitals:  Vitals Value Taken Time  BP 116/73 02/13/21 1644  Temp    Pulse 78 02/13/21 1648  Resp 15 02/13/21 1648  SpO2 100 % 02/13/21 1648  Vitals shown include unvalidated device data.  Last Pain:  Vitals:   02/13/21 1049  TempSrc:   PainSc: 0-No pain         Complications: No notable events documented.

## 2021-02-13 NOTE — H&P (Signed)
Date of Initial H&P: 02/12/2021  History reviewed, patient examined, no change in status, stable for surgery.   Endometrial biopsy could not be performed in the office to rule out hyperplasia or malignancy  therefore will plan  hysteroscopy D* &C with pathology to examine specimen intraoperatively .. if no malignancy will proceed with robotic assisted laparoscopic hysterectomy with bilateral salpingo oophorectomy.

## 2021-02-14 ENCOUNTER — Encounter (HOSPITAL_COMMUNITY): Payer: Self-pay | Admitting: Obstetrics and Gynecology

## 2021-02-14 DIAGNOSIS — N939 Abnormal uterine and vaginal bleeding, unspecified: Secondary | ICD-10-CM | POA: Diagnosis not present

## 2021-02-14 LAB — CBC
HCT: 38.9 % (ref 36.0–46.0)
Hemoglobin: 12.3 g/dL (ref 12.0–15.0)
MCH: 25.9 pg — ABNORMAL LOW (ref 26.0–34.0)
MCHC: 31.6 g/dL (ref 30.0–36.0)
MCV: 82.1 fL (ref 80.0–100.0)
Platelets: 215 10*3/uL (ref 150–400)
RBC: 4.74 MIL/uL (ref 3.87–5.11)
RDW: 13.9 % (ref 11.5–15.5)
WBC: 10 10*3/uL (ref 4.0–10.5)
nRBC: 0 % (ref 0.0–0.2)

## 2021-02-14 MED ORDER — IBUPROFEN 600 MG PO TABS: 600.0000 mg | ORAL_TABLET | Freq: Four times a day (QID) | ORAL | 1 refills | Status: AC | PRN

## 2021-02-14 MED ORDER — OXYCODONE HCL 5 MG PO TABS: 5.0000 mg | ORAL_TABLET | Freq: Four times a day (QID) | ORAL | 0 refills | Status: AC | PRN

## 2021-02-14 MED ORDER — CHLORHEXIDINE GLUCONATE CLOTH 2 % EX PADS
6.0000 | MEDICATED_PAD | Freq: Every day | CUTANEOUS | Status: DC
  Administered 2021-02-14: 6 via TOPICAL

## 2021-02-14 MED ORDER — WHITE PETROLATUM EX OINT
TOPICAL_OINTMENT | CUTANEOUS | Status: AC
  Filled 2021-02-14: qty 28.35

## 2021-02-14 NOTE — Progress Notes (Addendum)
GYN Progress Note  Subjective:  Patient is doing well. Pain is well-controlled. Tolerating regular diet.  Has ambulated this morning. Foley in place - replaced last night due to some bleeding. Denies fevers, chills, chest pain, SOB, N/V, or bilateral LE edema.  Objective: BP (!) 121/99 (BP Location: Left Arm)   Pulse 65   Temp 98.4 F (36.9 C) (Oral)   Resp 16   Ht 5\' 4"  (1.626 m)   Wt 128.4 kg   LMP  (LMP Unknown) Comment: Spotting  SpO2 98%   BMI 48.58 kg/m  Gen:  NAD, pleasant and cooperative Cardio:  RRR Pulm:  CTAB, no wheezes/rales/rhonchi Abd:  Soft, non-distended, appropriately tender throughout, no rebound/guarding Ext:  No bilateral LE edema, no bilateral calf tenderness, SCDs on an working Pelvic (primary RN as chaperone): foley catheter removed, small area of pad with dark red blood, vaginal laceration site without active bleeding, very minimal dark red blood present at vulva  Foley: ~900cc clear yellow urine in bag   A/P: POD#1 s/p Hysteroscopy D&C and RA-TLH/BSO and vaginal laceration repair.  - Doing well post-operatively - Pulm:  RA - GI:  DAT  Prophylaxis: Pepcid  - Renal:  AUOP, foley just removed - IVF:  SLIV - Pain:  Controlled with Tylenol/Motrin/Roxi PRN - DVT Prophylaxis:  SCDs in bed, frequent activity - Activity:  Ad lib - Labs:  None  Disposition: D/C home today after post-foley void.  Drema Dallas, DO

## 2021-02-14 NOTE — Discharge Summary (Signed)
Physician Discharge Summary  Patient ID: [name] MRN: [id number] DOB/AGE: 04/29/1960 60 y.o.  Admit date: 02/13/2021 Discharge date: 02/14/2021  Admission Diagnoses: Abnormal Uterine Bleeding BRCA2 Gene mutation  Discharge Diagnoses:  Active Problems:   Abnormal uterine bleeding BRCA2 Gene Mutation  Procedure(s): Hysteroscopy with Dilation and Curettage, Robotic Assisted Total Laparoscopic Hysterectomy with Bilateral Salpingo-oophorectomy.  Discharged Condition: good  Hospital Course: Patient was admitted on 02/13/2021 for the above named procedure(s) for the above named diagnoses. Prior to hospital discharge, patient was tolerating PO, ambulating, voiding spontaneously, passing flatus, and pain was well-controlled. See hospital chart for specific details. Patient was discharged home in stable condition.  Consults: None  Significant Diagnostic Studies: None  Treatments: surgery: As above  Discharge Exam: Blood pressure (!) 121/99, pulse 65, temperature 98.4 F (36.9 C), temperature source Oral, resp. rate 16, height $RemoveBe'5\' 4"'rmmMRDMrF$  (1.626 m), weight 128.4 kg, SpO2 98 %. Gen:  NAD, pleasant and cooperative Cardio:  RRR Pulm:  CTAB, no wheezes/rales/rhonchi Abd:  Soft, non-distended, appropriately tender throughout, no rebound/guarding Ext:  No bilateral LE edema, no bilateral calf tenderness, SCDs on an working Pelvic (primary RN as chaperone): foley catheter removed, small area of pad with dark red blood, vaginal laceration site without active bleeding, very minimal dark red blood present at vulva  Disposition: Discharge disposition: 01-Home or Self Care        Allergies as of 02/14/2021       Reactions   Shrimp [shellfish Allergy] Nausea Only   And abdominal pain        Medication List     STOP taking these medications    acetaminophen 500 MG tablet Commonly known as: TYLENOL       TAKE these medications    Biotin 5000 MCG Caps Take 5,000 Units by  mouth daily.   GLUCOSAMINE HCL PO Take 1 capsule by mouth daily. [facility]   ibuprofen 200 MG tablet Commonly known as: ADVIL Take 200-800 mg by mouth every 6 (six) hours as needed for mild pain. What changed: Another medication with the same name was added. Make sure you understand how and when to take each.   ibuprofen 600 MG tablet Commonly known as: ADVIL Take 1 tablet (600 mg total) by mouth every 6 (six) hours as needed for mild pain, moderate pain or cramping. What changed: You were already taking a medication with the same name, and this prescription was added. Make sure you understand how and when to take each.   MAGNESIUM PO Take 325 mg by mouth daily. Citrate power   multivitamin tablet Take 1 tablet by mouth daily. [facility]   oxyCODONE 5 MG immediate release tablet Commonly known as: Oxy IR/ROXICODONE Take 1-2 tablets (5-10 mg total) by mouth every 6 (six) hours as needed for moderate pain, severe pain or breakthrough pain.   vitamin C 500 MG tablet Commonly known as: ASCORBIC ACID Take 500 mg by mouth daily.   Vitamin D-3 25 MCG (1000 UT) Caps Take 1 tablet by mouth every other day.        Follow-up Information     Christophe Louis, MD Follow up in 2 week(s).   Specialty: Obstetrics and Gynecology Contact information: [address] [place] [address] [phone]                 Signed: Drema Dallas 02/14/2021, 8:24 AM

## 2021-02-15 LAB — SURGICAL PATHOLOGY

## 2021-02-15 NOTE — Anesthesia Postprocedure Evaluation (Signed)
Anesthesia Post Note  Patient: FRANSHESCA CHIPMAN  Procedure(s) Performed: DILATATION AND CURETTAGE /HYSTEROSCOPY WITH FROZEN PATHOLOGY. (Uterus) XI ROBOTIC ASSISTED LAPAROSCOPIC HYSTERECTOMY AND BILATERALSALPINGO-OOPHORECTOMY. (Bilateral: Abdomen)     Patient location during evaluation: PACU Anesthesia Type: General Level of consciousness: awake and alert Pain management: pain level controlled Vital Signs Assessment: post-procedure vital signs reviewed and stable Respiratory status: spontaneous breathing, nonlabored ventilation, respiratory function stable and patient connected to nasal cannula oxygen Cardiovascular status: blood pressure returned to baseline and stable Postop Assessment: no apparent nausea or vomiting Anesthetic complications: no   No notable events documented.  Last Vitals:  Vitals:   02/14/21 0426 02/14/21 0916  BP: (!) 121/99 (!) 113/50  Pulse: 65 69  Resp: 16 18  Temp: 36.9 C 37.1 C  SpO2: 98% 99%    Last Pain:  Vitals:   02/14/21 0954  TempSrc:   PainSc: 0-No pain                 Raychelle Hudman

## 2022-01-21 ENCOUNTER — Other Ambulatory Visit: Payer: Self-pay | Admitting: Obstetrics and Gynecology

## 2022-01-21 DIAGNOSIS — Z1509 Genetic susceptibility to other malignant neoplasm: Secondary | ICD-10-CM

## 2022-02-08 ENCOUNTER — Ambulatory Visit
Admission: RE | Admit: 2022-02-08 | Discharge: 2022-02-08 | Disposition: A | Discharge status: Home / Self Care | Payer: BC Managed Care – PPO | Source: Ambulatory Visit | Attending: Obstetrics and Gynecology | Admitting: Obstetrics and Gynecology

## 2022-02-08 DIAGNOSIS — Z1501 Genetic susceptibility to malignant neoplasm of breast: Secondary | ICD-10-CM

## 2022-02-08 MED ORDER — GADOPICLENOL 0.5 MMOL/ML IV SOLN
10.0000 mL | Freq: Once | INTRAVENOUS | Status: AC | PRN
  Administered 2022-02-08: 10 mL via INTRAVENOUS

## 2022-02-11 ENCOUNTER — Other Ambulatory Visit: Payer: Self-pay | Admitting: Obstetrics and Gynecology

## 2022-02-11 DIAGNOSIS — R928 Other abnormal and inconclusive findings on diagnostic imaging of breast: Secondary | ICD-10-CM

## 2022-02-17 ENCOUNTER — Ambulatory Visit
Admission: RE | Admit: 2022-02-17 | Discharge: 2022-02-17 | Disposition: A | Discharge status: Home / Self Care | Payer: BC Managed Care – PPO | Source: Ambulatory Visit | Attending: Obstetrics and Gynecology | Admitting: Obstetrics and Gynecology

## 2022-02-17 ENCOUNTER — Other Ambulatory Visit: Payer: Self-pay | Admitting: Obstetrics and Gynecology

## 2022-02-17 DIAGNOSIS — R928 Other abnormal and inconclusive findings on diagnostic imaging of breast: Secondary | ICD-10-CM

## 2022-02-26 ENCOUNTER — Ambulatory Visit
Admission: RE | Admit: 2022-02-26 | Discharge: 2022-02-26 | Disposition: A | Discharge status: Home / Self Care | Payer: BC Managed Care – PPO | Source: Ambulatory Visit | Attending: Obstetrics and Gynecology | Admitting: Obstetrics and Gynecology

## 2022-02-26 DIAGNOSIS — R928 Other abnormal and inconclusive findings on diagnostic imaging of breast: Secondary | ICD-10-CM

## 2022-02-26 DIAGNOSIS — Z803 Family history of malignant neoplasm of breast: Secondary | ICD-10-CM

## 2022-02-26 DIAGNOSIS — Z8481 Family history of carrier of genetic disease: Secondary | ICD-10-CM

## 2022-02-26 DIAGNOSIS — Z1502 Genetic susceptibility to malignant neoplasm of ovary: Secondary | ICD-10-CM

## 2022-02-26 HISTORY — PX: BREAST BIOPSY: SHX20

## 2022-02-26 MED ORDER — GADOTERIDOL 279.3 MG/ML IV SOLN
10.0000 mL | Freq: Once | INTRAVENOUS | Status: AC | PRN
  Administered 2022-02-26: 10 mL via INTRAVENOUS

## 2022-03-14 IMAGING — MR MR BREAST BILAT WO/W CM
7 of 9 series · 34 of 48 positions shown · IV contrast (gadavist)
Comparison: Previous exam(s).

CLINICAL DATA: 60-year-old female with BRCA 2 gene mutation.

LABS:  None performed on site.
EXAM:
BILATERAL BREAST MRI WITH AND WITHOUT CONTRAST
TECHNIQUE: Multiplanar, multisequence MR images of both breasts were obtained
prior to and following the intravenous administration of 10 ml of
Gadavist.

[Series 3: t2_tirm_tra ipat · axial · 3.0mm · 0.78mm/px · 1 of 59 slices shown]
[im 1/59]
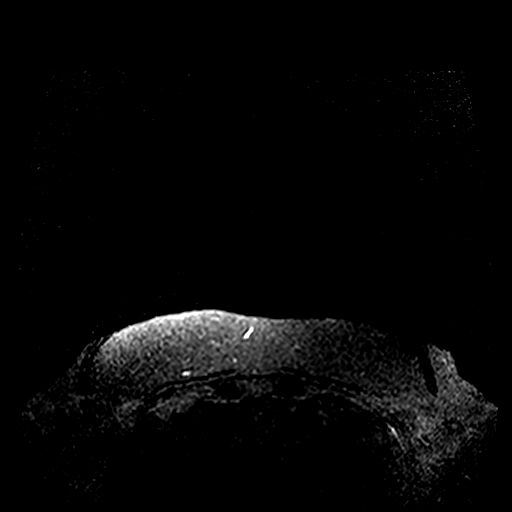

[Series 4: fl3d pre-cm no · axial · non-contrast · 1.2mm · 1.04mm/px · z∈[-33,+139]mm · 5 of 144 slices shown]
[im 1/144]
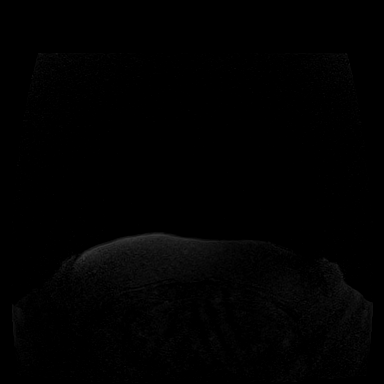
[im 36/144]
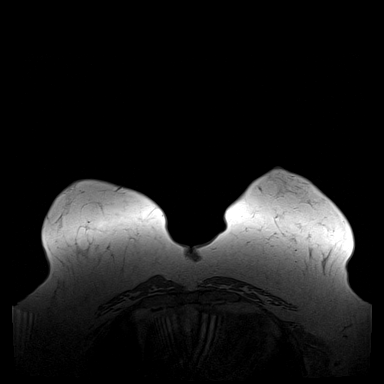
[im 72/144]
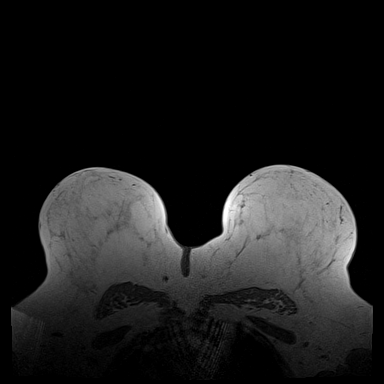
[im 108/144]
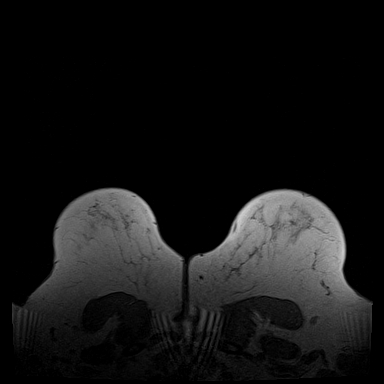
[im 144/144]
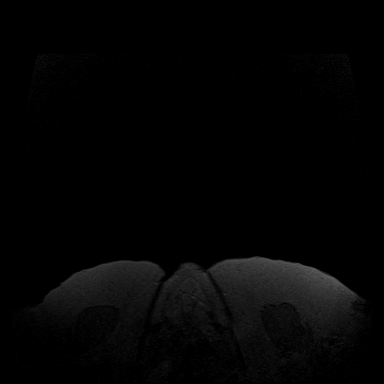

[Series 5: fl3d pre-cm · axial · non-contrast · 1.2mm · 1.04mm/px · z∈[-33,+139]mm · 6 of 144 slices shown]
[im 1/144]
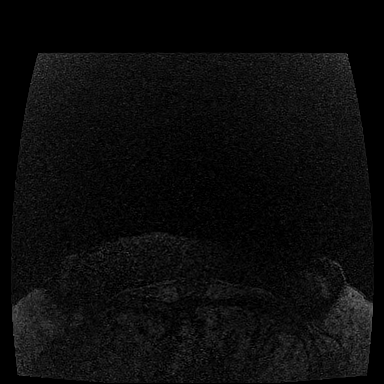
[im 29/144]
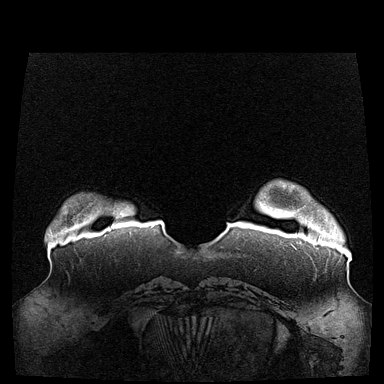
[im 58/144]
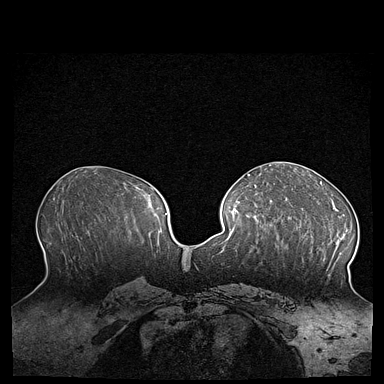
[im 86/144]
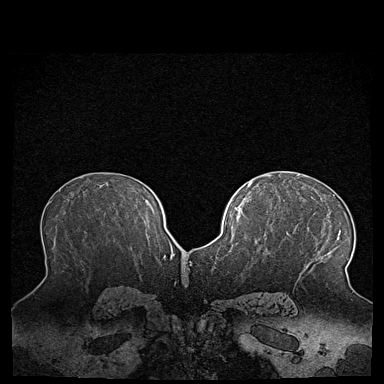
[im 115/144]
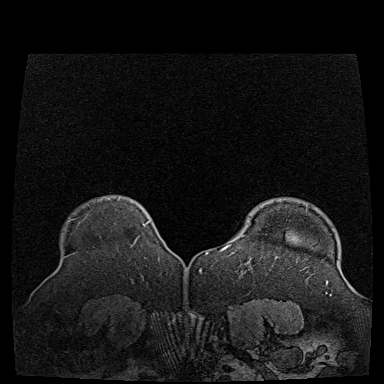
[im 144/144]
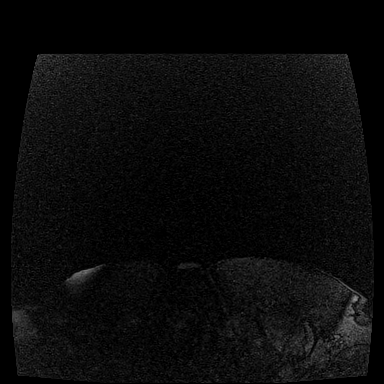

[Series 6: fl3d post-cm 20 · axial · 1.2mm · 1.04mm/px · z∈[-33,+139]mm · 6 of 144 slices shown (1 of 2)]
[im 1/144]
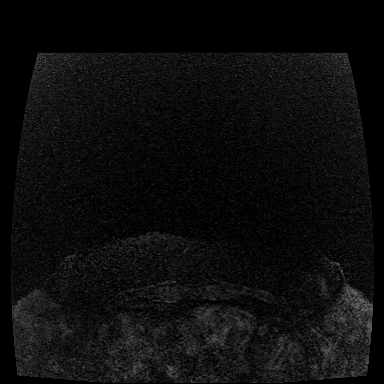
[im 29/144]
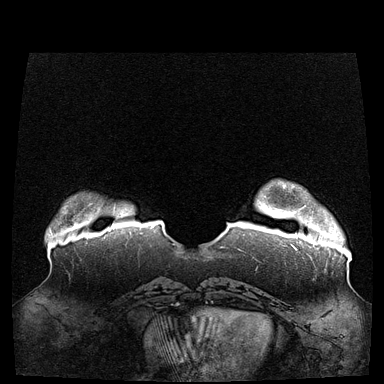
[im 58/144]
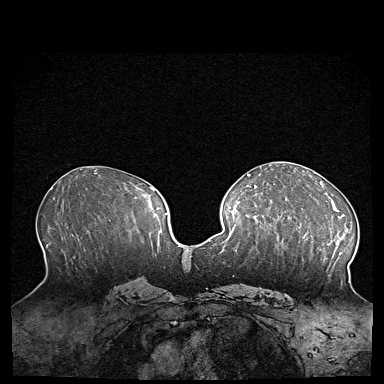
[im 86/144]
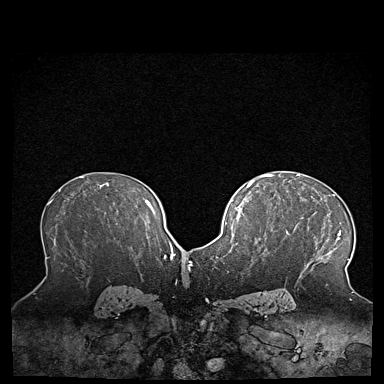
[im 115/144]
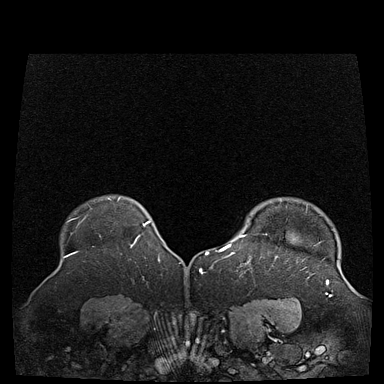
[im 144/144]
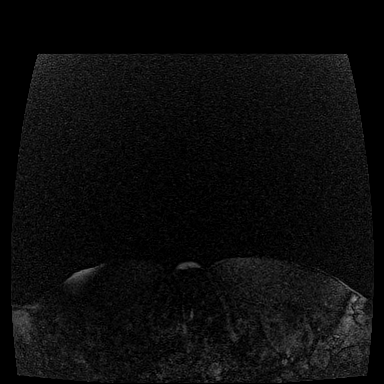

[Series 7: fl3d post-cm 20 · axial · 1.2mm · 1.04mm/px · z∈[-33,+139]mm · 6 of 144 slices shown (2 of 2)]
[im 1/144]
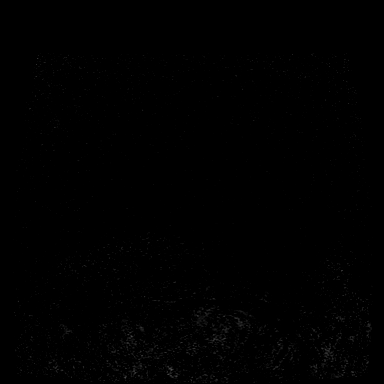
[im 29/144]
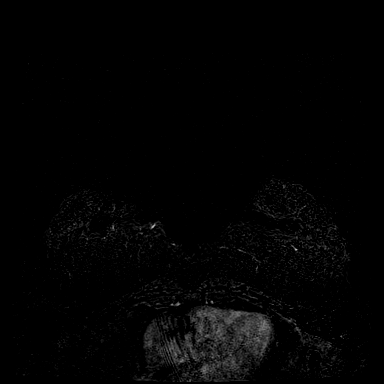
[im 58/144]
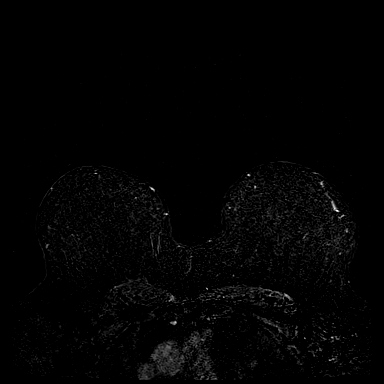
[im 86/144]
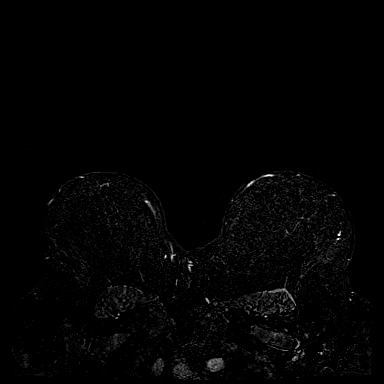
[im 115/144]
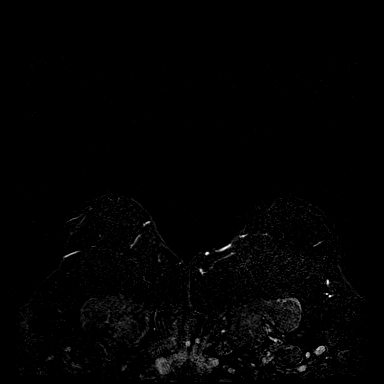
[im 144/144]
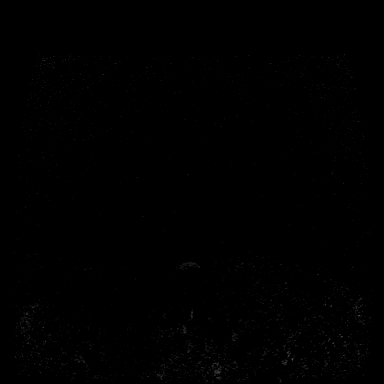

[Series 9: fl3d post-cm 3min · axial · 1.2mm · 1.04mm/px · z∈[-33,+139]mm · 6 of 144 slices shown]
[im 1/144]
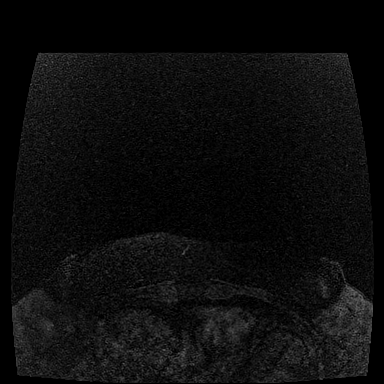
[im 29/144]
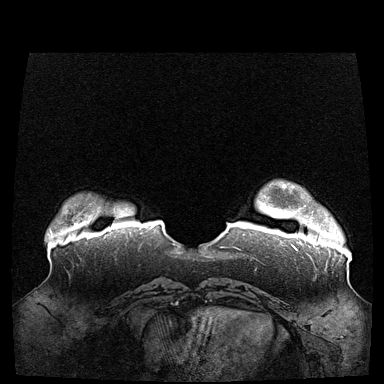
[im 58/144]
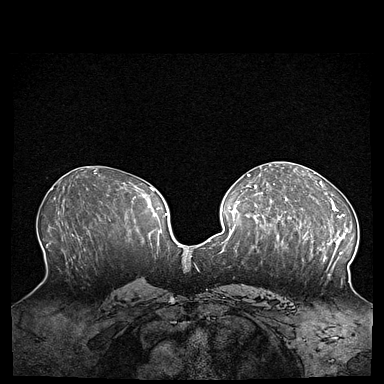
[im 86/144]
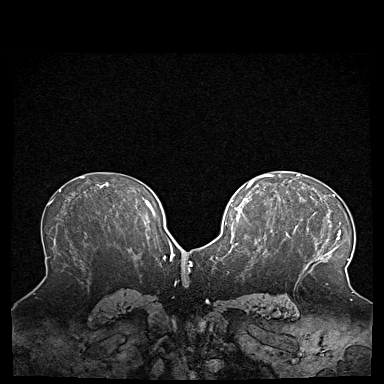
[im 115/144]
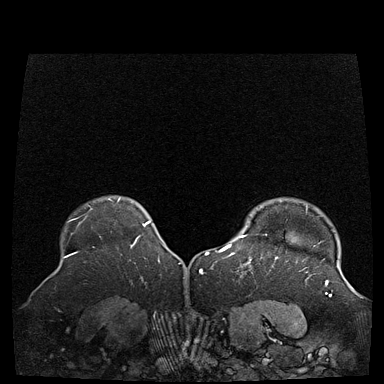
[im 144/144]
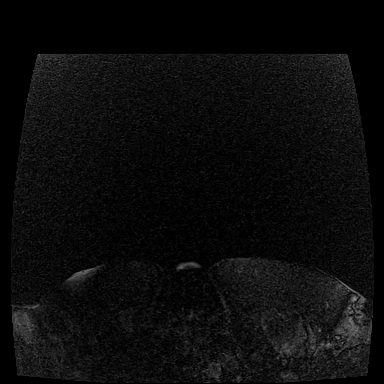

[Series 10: fl3d post-cm 3min_sub · axial · 1.2mm · 1.04mm/px · z∈[-33,+69]mm · 4 of 144 slices shown]
[im 1/144]
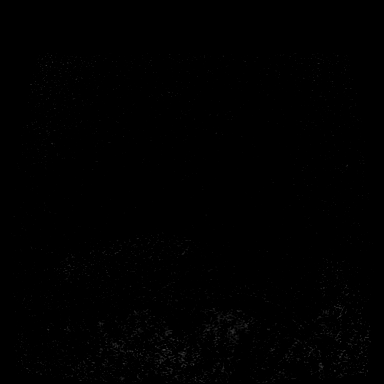
[im 29/144]
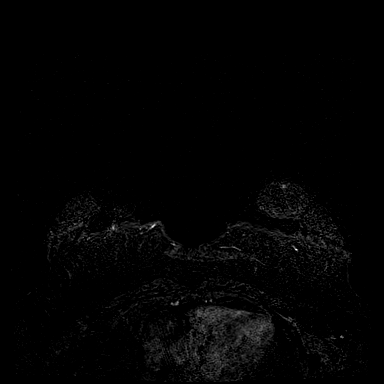
[im 58/144]
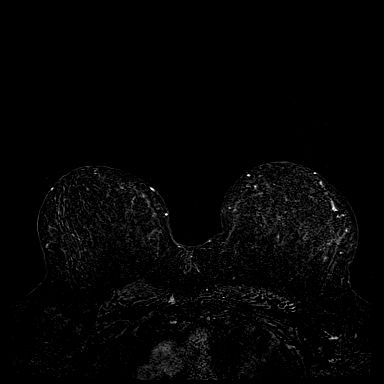
[im 86/144]
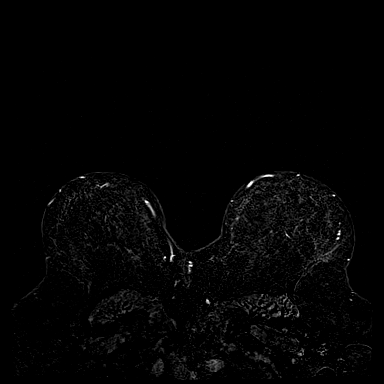

[34 of 48 positions shown; findings below may reference images not displayed]

Three-dimensional MR images were rendered by post-processing of the
original MR data on an independent workstation. The
three-dimensional MR images were interpreted, and findings are
reported in the following complete MRI report for this study. Three
dimensional images were evaluated at the independent interpreting
workstation using the DynaCAD thin client.
FINDINGS: Breast composition: a. Almost entirely fat.

Background parenchymal enhancement: Mild.

Right breast: No suspicious mass or abnormal enhancement.

Left breast: No suspicious mass or abnormal enhancement.

Lymph nodes: No abnormal appearing lymph nodes.

Ancillary findings:  None.
IMPRESSION: No MRI evidence of malignancy in either breast.

RECOMMENDATION:
Routine annual screening with mammography and breast MRI.

BI-RADS CATEGORY  1: Negative.

## 2023-01-23 ENCOUNTER — Other Ambulatory Visit: Payer: Self-pay | Admitting: Obstetrics and Gynecology

## 2023-01-23 DIAGNOSIS — Z1371 Encounter for nonprocreative screening for genetic disease carrier status: Secondary | ICD-10-CM

## 2023-02-24 ENCOUNTER — Encounter: Payer: Self-pay | Admitting: Obstetrics and Gynecology

## 2023-03-07 ENCOUNTER — Ambulatory Visit
Admission: RE | Admit: 2023-03-07 | Discharge: 2023-03-07 | Disposition: A | Discharge status: Home / Self Care | Payer: BC Managed Care – PPO | Source: Ambulatory Visit | Attending: Obstetrics and Gynecology | Admitting: Obstetrics and Gynecology

## 2023-03-07 DIAGNOSIS — Z1371 Encounter for nonprocreative screening for genetic disease carrier status: Secondary | ICD-10-CM

## 2023-03-07 MED ORDER — GADOPICLENOL 0.5 MMOL/ML IV SOLN
10.0000 mL | Freq: Once | INTRAVENOUS | Status: AC | PRN
  Administered 2023-03-07: 10 mL via INTRAVENOUS

## 2023-09-15 ENCOUNTER — Encounter: Payer: Self-pay | Admitting: Genetic Counselor

## 2024-01-18 ENCOUNTER — Other Ambulatory Visit: Payer: Self-pay | Admitting: Gastroenterology

## 2024-01-26 ENCOUNTER — Other Ambulatory Visit: Payer: Self-pay | Admitting: Obstetrics and Gynecology

## 2024-01-26 DIAGNOSIS — Z1371 Encounter for nonprocreative screening for genetic disease carrier status: Secondary | ICD-10-CM

## 2024-02-24 ENCOUNTER — Other Ambulatory Visit: Payer: Self-pay | Admitting: Gastroenterology

## 2024-03-08 ENCOUNTER — Ambulatory Visit
Admission: RE | Admit: 2024-03-08 | Discharge: 2024-03-08 | Disposition: A | Source: Ambulatory Visit | Attending: Obstetrics and Gynecology | Admitting: Obstetrics and Gynecology

## 2024-03-08 DIAGNOSIS — Z1371 Encounter for nonprocreative screening for genetic disease carrier status: Secondary | ICD-10-CM

## 2024-03-08 MED ORDER — GADOPICLENOL 0.5 MMOL/ML IV SOLN
10.0000 mL | Freq: Once | INTRAVENOUS | Status: AC | PRN
  Administered 2024-03-08: 10 mL via INTRAVENOUS

## 2024-04-27 ENCOUNTER — Encounter (HOSPITAL_COMMUNITY): Payer: Self-pay

## 2024-04-27 ENCOUNTER — Ambulatory Visit (HOSPITAL_COMMUNITY): Admit: 2024-04-27 | Payer: Self-pay | Admitting: Gastroenterology

## 2024-04-27 SURGERY — COLONOSCOPY
Anesthesia: Monitor Anesthesia Care
